# Patient Record
Sex: Female | Born: 1940 | Race: White | Hispanic: No | Marital: Married | Smoking: Never smoker
Health system: Southern US, Community
[De-identification: ages and names within clinical notes are randomized; demographics above are authoritative.]

## PROBLEM LIST (undated history)

## (undated) DIAGNOSIS — IMO0002 Reserved for concepts with insufficient information to code with codable children: Secondary | ICD-10-CM

## (undated) DIAGNOSIS — I10 Essential (primary) hypertension: Secondary | ICD-10-CM

## (undated) DIAGNOSIS — M8580 Other specified disorders of bone density and structure, unspecified site: Secondary | ICD-10-CM

## (undated) DIAGNOSIS — R011 Cardiac murmur, unspecified: Secondary | ICD-10-CM

## (undated) DIAGNOSIS — C4490 Unspecified malignant neoplasm of skin, unspecified: Secondary | ICD-10-CM

## (undated) HISTORY — DX: Other specified disorders of bone density and structure, unspecified site: M85.80

## (undated) HISTORY — DX: Unspecified malignant neoplasm of skin, unspecified: C44.90

## (undated) HISTORY — DX: Essential (primary) hypertension: I10

## (undated) HISTORY — DX: Reserved for concepts with insufficient information to code with codable children: IMO0002

## (undated) HISTORY — DX: Cardiac murmur, unspecified: R01.1

---

## 1999-01-21 ENCOUNTER — Other Ambulatory Visit: Admission: RE | Admit: 1999-01-21 | Discharge: 1999-01-21 | Payer: Self-pay | Admitting: Family Medicine

## 2000-01-03 ENCOUNTER — Other Ambulatory Visit: Admission: RE | Admit: 2000-01-03 | Discharge: 2000-01-03 | Payer: Self-pay | Admitting: Obstetrics and Gynecology

## 2000-02-10 ENCOUNTER — Encounter: Payer: Self-pay | Admitting: Obstetrics and Gynecology

## 2000-02-10 ENCOUNTER — Encounter: Admission: RE | Admit: 2000-02-10 | Discharge: 2000-02-10 | Payer: Self-pay | Admitting: Obstetrics and Gynecology

## 2000-05-14 ENCOUNTER — Encounter: Payer: Self-pay | Admitting: Obstetrics and Gynecology

## 2000-05-14 ENCOUNTER — Encounter: Admission: RE | Admit: 2000-05-14 | Discharge: 2000-05-14 | Payer: Self-pay | Admitting: Obstetrics and Gynecology

## 2001-01-03 ENCOUNTER — Other Ambulatory Visit: Admission: RE | Admit: 2001-01-03 | Discharge: 2001-01-03 | Payer: Self-pay | Admitting: Obstetrics and Gynecology

## 2001-03-19 ENCOUNTER — Ambulatory Visit (HOSPITAL_COMMUNITY): Admission: RE | Admit: 2001-03-19 | Discharge: 2001-03-19 | Payer: Self-pay | Admitting: Gastroenterology

## 2001-05-16 ENCOUNTER — Encounter: Admission: RE | Admit: 2001-05-16 | Discharge: 2001-05-16 | Payer: Self-pay | Admitting: Obstetrics and Gynecology

## 2001-05-16 ENCOUNTER — Encounter: Payer: Self-pay | Admitting: Obstetrics and Gynecology

## 2002-01-09 ENCOUNTER — Other Ambulatory Visit: Admission: RE | Admit: 2002-01-09 | Discharge: 2002-01-09 | Payer: Self-pay | Admitting: Obstetrics and Gynecology

## 2002-05-20 ENCOUNTER — Encounter: Admission: RE | Admit: 2002-05-20 | Discharge: 2002-05-20 | Payer: Self-pay | Admitting: Obstetrics and Gynecology

## 2002-05-20 ENCOUNTER — Encounter: Payer: Self-pay | Admitting: Obstetrics and Gynecology

## 2003-01-12 ENCOUNTER — Other Ambulatory Visit: Admission: RE | Admit: 2003-01-12 | Discharge: 2003-01-12 | Payer: Self-pay | Admitting: Obstetrics and Gynecology

## 2003-05-28 ENCOUNTER — Encounter: Admission: RE | Admit: 2003-05-28 | Discharge: 2003-05-28 | Payer: Self-pay | Admitting: Obstetrics and Gynecology

## 2003-05-28 ENCOUNTER — Encounter: Payer: Self-pay | Admitting: Obstetrics and Gynecology

## 2004-01-14 ENCOUNTER — Other Ambulatory Visit: Admission: RE | Admit: 2004-01-14 | Discharge: 2004-01-14 | Payer: Self-pay | Admitting: Obstetrics and Gynecology

## 2004-06-07 ENCOUNTER — Encounter: Admission: RE | Admit: 2004-06-07 | Discharge: 2004-06-07 | Payer: Self-pay | Admitting: Obstetrics and Gynecology

## 2005-01-17 ENCOUNTER — Other Ambulatory Visit: Admission: RE | Admit: 2005-01-17 | Discharge: 2005-01-17 | Payer: Self-pay | Admitting: *Deleted

## 2005-06-13 ENCOUNTER — Encounter: Admission: RE | Admit: 2005-06-13 | Discharge: 2005-06-13 | Payer: Self-pay | Admitting: Obstetrics and Gynecology

## 2006-01-18 ENCOUNTER — Other Ambulatory Visit: Admission: RE | Admit: 2006-01-18 | Discharge: 2006-01-18 | Payer: Self-pay | Admitting: Obstetrics & Gynecology

## 2006-06-15 ENCOUNTER — Encounter: Admission: RE | Admit: 2006-06-15 | Discharge: 2006-06-15 | Payer: Self-pay | Admitting: Obstetrics & Gynecology

## 2007-01-24 ENCOUNTER — Other Ambulatory Visit: Admission: RE | Admit: 2007-01-24 | Discharge: 2007-01-24 | Payer: Self-pay | Admitting: Obstetrics & Gynecology

## 2007-06-28 ENCOUNTER — Encounter: Admission: RE | Admit: 2007-06-28 | Discharge: 2007-06-28 | Payer: Self-pay | Admitting: Family Medicine

## 2008-01-30 ENCOUNTER — Other Ambulatory Visit: Admission: RE | Admit: 2008-01-30 | Discharge: 2008-01-30 | Payer: Self-pay | Admitting: Obstetrics & Gynecology

## 2008-06-29 ENCOUNTER — Encounter: Admission: RE | Admit: 2008-06-29 | Discharge: 2008-06-29 | Payer: Self-pay | Admitting: Internal Medicine

## 2009-02-04 LAB — HM PAP SMEAR: HM Pap smear: NORMAL

## 2009-06-18 HISTORY — PX: TOTAL VAGINAL HYSTERECTOMY: SHX2548

## 2009-06-22 ENCOUNTER — Encounter: Payer: Self-pay | Admitting: Obstetrics & Gynecology

## 2009-06-22 ENCOUNTER — Ambulatory Visit (HOSPITAL_COMMUNITY): Admission: RE | Admit: 2009-06-22 | Discharge: 2009-06-23 | Payer: Self-pay | Admitting: Obstetrics & Gynecology

## 2009-07-12 ENCOUNTER — Encounter: Admission: RE | Admit: 2009-07-12 | Discharge: 2009-07-12 | Payer: Self-pay | Admitting: Internal Medicine

## 2010-07-13 ENCOUNTER — Encounter: Admission: RE | Admit: 2010-07-13 | Discharge: 2010-07-13 | Payer: Self-pay | Admitting: Obstetrics & Gynecology

## 2010-12-22 LAB — BASIC METABOLIC PANEL
BUN: 4 mg/dL — ABNORMAL LOW (ref 6–23)
Chloride: 96 mEq/L (ref 96–112)
Creatinine, Ser: 0.77 mg/dL (ref 0.4–1.2)

## 2010-12-22 LAB — CBC
MCV: 95.7 fL (ref 78.0–100.0)
Platelets: 171 10*3/uL (ref 150–400)
WBC: 11.3 10*3/uL — ABNORMAL HIGH (ref 4.0–10.5)

## 2010-12-22 LAB — ABO/RH: ABO/RH(D): AB POS

## 2010-12-22 LAB — TYPE AND SCREEN: ABO/RH(D): AB POS

## 2010-12-23 LAB — PROTIME-INR
INR: 1 (ref 0.00–1.49)
Prothrombin Time: 13.4 seconds (ref 11.6–15.2)

## 2010-12-23 LAB — URINALYSIS, ROUTINE W REFLEX MICROSCOPIC
Bilirubin Urine: NEGATIVE
Glucose, UA: NEGATIVE mg/dL
Ketones, ur: NEGATIVE mg/dL
Nitrite: NEGATIVE
Protein, ur: NEGATIVE mg/dL
Specific Gravity, Urine: 1.008 (ref 1.005–1.030)
Urobilinogen, UA: 0.2 mg/dL (ref 0.0–1.0)
pH: 6.5 (ref 5.0–8.0)

## 2010-12-23 LAB — BASIC METABOLIC PANEL
BUN: 8 mg/dL (ref 6–23)
Calcium: 9.2 mg/dL (ref 8.4–10.5)
Chloride: 107 mEq/L (ref 96–112)
GFR calc Af Amer: >60 mL/min (ref >60)

## 2010-12-23 LAB — CBC
MCV: 94.5 fL (ref 78.0–100.0)
Platelets: 179 10*3/uL (ref 150–400)
RDW: 13.2 % (ref 11.5–15.5)

## 2010-12-23 LAB — APTT: aPTT: 29 seconds (ref 24–37)

## 2011-02-03 NOTE — Procedures (Signed)
. [facility]  Patient:    Rose Olson, Rose Olson                    MRN: [id number] Proc. Date: 03/19/01 Attending:  Anselmo Rod, M.D. CC:         Laqueta Linden, M.D.   Procedure Report  DATE OF BIRTH:  1941/04/25.  PROCEDURE:  Colonoscopy.  ENDOSCOPIST:  Anselmo Rod, M.D.  INSTRUMENT USED:  Olympus video colonoscope.  INDICATION FOR PROCEDURE:  Guaiac-positive stools in a 70 year old white female with a family history of breast cancer.  Rule out colonic polyps, masses, hemorrhoids, etc.  PREPROCEDURE PREPARATION:  Informed consent was procured from the patient. The patient was fasted for eight hours prior to the procedure and prepped with a bottle of magnesium citrate and a gallon of NuLytely the night prior to the procedure.  PREPROCEDURE PHYSICAL:  VITAL SIGNS:  The patient had stable vital signs.  NECK:  Supple.  CHEST:  Clear to auscultation.  S1, S2 regular.  ABDOMEN:  Soft with normal bowel sounds.  DESCRIPTION OF PROCEDURE:  The patient was placed in the left lateral decubitus position and sedated with 50 mg of Demerol and 5 mg of Versed intravenously.  Once the patient was adequately sedate and maintained on low-flow oxygen and continuous cardiac monitoring, the Olympus video colonoscope was advanced from the rectum to the cecum with difficulty secondary to a large amount of residual stool, especially in the right colon. There was left-sided diverticulosis with some stool in some of the diverticular pocket.  No masses, polyps, erosions, or ulcerations were seen. No masses were identified.  The patient tolerated the procedure well without complication.  The procedure was complete up to the cecum; however, because of the incomplete prep, very small lesions could have been missed.  RECOMMENDATIONS: 1. A high-fiber diet has been discussed in great detail with the patient, with    liberal fluid intake. 2. Repeat guaiacs  will be done on an outpatient basis and further    recommendations made thereafter. DD:  03/19/01 TD:  03/20/01 Job: [id number]

## 2011-05-02 ENCOUNTER — Other Ambulatory Visit: Payer: Self-pay | Admitting: Family Medicine

## 2011-05-02 DIAGNOSIS — Z1231 Encounter for screening mammogram for malignant neoplasm of breast: Secondary | ICD-10-CM

## 2011-07-17 ENCOUNTER — Ambulatory Visit
Admission: RE | Admit: 2011-07-17 | Discharge: 2011-07-17 | Disposition: A | Discharge status: Home / Self Care | Payer: Medicare Other | Source: Ambulatory Visit | Attending: Family Medicine | Admitting: Family Medicine

## 2011-07-17 DIAGNOSIS — Z1231 Encounter for screening mammogram for malignant neoplasm of breast: Secondary | ICD-10-CM

## 2011-10-02 DIAGNOSIS — Z1331 Encounter for screening for depression: Secondary | ICD-10-CM | POA: Diagnosis not present

## 2011-10-02 DIAGNOSIS — I10 Essential (primary) hypertension: Secondary | ICD-10-CM | POA: Diagnosis not present

## 2012-03-11 DIAGNOSIS — Z124 Encounter for screening for malignant neoplasm of cervix: Secondary | ICD-10-CM | POA: Diagnosis not present

## 2012-03-11 DIAGNOSIS — Z01419 Encounter for gynecological examination (general) (routine) without abnormal findings: Secondary | ICD-10-CM | POA: Diagnosis not present

## 2012-04-29 ENCOUNTER — Other Ambulatory Visit: Payer: Self-pay | Admitting: Family Medicine

## 2012-04-29 DIAGNOSIS — Z1231 Encounter for screening mammogram for malignant neoplasm of breast: Secondary | ICD-10-CM

## 2012-05-27 DIAGNOSIS — H353190 Nonexudative age-related macular degeneration, unspecified eye, stage unspecified: Secondary | ICD-10-CM | POA: Diagnosis not present

## 2012-05-27 DIAGNOSIS — H2510 Age-related nuclear cataract, unspecified eye: Secondary | ICD-10-CM | POA: Diagnosis not present

## 2012-07-08 DIAGNOSIS — H353190 Nonexudative age-related macular degeneration, unspecified eye, stage unspecified: Secondary | ICD-10-CM | POA: Diagnosis not present

## 2012-07-17 ENCOUNTER — Other Ambulatory Visit: Payer: Self-pay | Admitting: Obstetrics & Gynecology

## 2012-07-17 ENCOUNTER — Ambulatory Visit
Admission: RE | Admit: 2012-07-17 | Discharge: 2012-07-17 | Disposition: A | Discharge status: Home / Self Care | Payer: Medicare Other | Source: Ambulatory Visit | Attending: Family Medicine | Admitting: Family Medicine

## 2012-07-17 DIAGNOSIS — Z1231 Encounter for screening mammogram for malignant neoplasm of breast: Secondary | ICD-10-CM | POA: Diagnosis not present

## 2012-07-17 DIAGNOSIS — Z23 Encounter for immunization: Secondary | ICD-10-CM | POA: Diagnosis not present

## 2012-07-19 ENCOUNTER — Other Ambulatory Visit: Payer: Self-pay | Admitting: Family Medicine

## 2012-07-19 DIAGNOSIS — R928 Other abnormal and inconclusive findings on diagnostic imaging of breast: Secondary | ICD-10-CM

## 2012-07-23 DIAGNOSIS — H40029 Open angle with borderline findings, high risk, unspecified eye: Secondary | ICD-10-CM | POA: Diagnosis not present

## 2012-07-29 ENCOUNTER — Ambulatory Visit
Admission: RE | Admit: 2012-07-29 | Discharge: 2012-07-29 | Disposition: A | Discharge status: Home / Self Care | Payer: Medicare Other | Source: Ambulatory Visit | Attending: Family Medicine | Admitting: Family Medicine

## 2012-07-29 DIAGNOSIS — R928 Other abnormal and inconclusive findings on diagnostic imaging of breast: Secondary | ICD-10-CM

## 2012-10-02 DIAGNOSIS — Z0000 Encounter for general adult medical examination without abnormal findings: Secondary | ICD-10-CM | POA: Diagnosis not present

## 2012-10-02 DIAGNOSIS — Z1331 Encounter for screening for depression: Secondary | ICD-10-CM | POA: Diagnosis not present

## 2012-10-02 DIAGNOSIS — I10 Essential (primary) hypertension: Secondary | ICD-10-CM | POA: Diagnosis not present

## 2013-03-06 ENCOUNTER — Other Ambulatory Visit: Payer: Self-pay | Admitting: Obstetrics & Gynecology

## 2013-04-02 DIAGNOSIS — R079 Chest pain, unspecified: Secondary | ICD-10-CM | POA: Diagnosis not present

## 2013-04-02 DIAGNOSIS — I10 Essential (primary) hypertension: Secondary | ICD-10-CM | POA: Diagnosis not present

## 2013-04-02 DIAGNOSIS — R011 Cardiac murmur, unspecified: Secondary | ICD-10-CM | POA: Diagnosis not present

## 2013-04-09 ENCOUNTER — Encounter: Payer: Self-pay | Admitting: Obstetrics & Gynecology

## 2013-04-10 ENCOUNTER — Ambulatory Visit (INDEPENDENT_AMBULATORY_CARE_PROVIDER_SITE_OTHER): Payer: Medicare Other | Admitting: Obstetrics & Gynecology

## 2013-04-10 ENCOUNTER — Encounter: Payer: Self-pay | Admitting: Obstetrics & Gynecology

## 2013-04-10 VITALS — BP 138/62 | HR 56 | Resp 20 | Ht 62.5 in | Wt 110.2 lb

## 2013-04-10 DIAGNOSIS — Z124 Encounter for screening for malignant neoplasm of cervix: Secondary | ICD-10-CM

## 2013-04-10 DIAGNOSIS — Z01419 Encounter for gynecological examination (general) (routine) without abnormal findings: Secondary | ICD-10-CM

## 2013-04-10 MED ORDER — EVISTA 60 MG PO TABS: 60.0000 mg | ORAL_TABLET | Freq: Every day | ORAL | Status: DC

## 2013-04-10 NOTE — Progress Notes (Signed)
72 y.o. [id number] MarriedCaucasianF here for annual exam.  Doing well.  No vaginal bleeding.  Does report she gets up about once a night to urinate.  No leaking.  She has questions about her vitamins.  She also questions whether Splenda is "safe".  Patient's last menstrual period was 09/18/1989.          Sexually active: yes  The current method of family planning is status post hysterectomy.    Exercising: yes  aerobics and walking Smoker:  no  Health Maintenance: Pap:  02/04/09 WNL History of abnormal Pap:  no MMG:  07/17/12 MMG, 07/29/12 Diag left MMG/US-screening in one year Colonoscopy:  9/07 repeat in 10 years BMD:   10/11, -1.4/-2.1 TDaP:  9/07 Screening Labs: 10/02/12--CMP and Lipids--normal.  Pt brought copies for me to see but wanted to keep the originals   reports that she has never smoked. She has never used smokeless tobacco. She reports that she does not drink alcohol or use illicit drugs.  Past Medical History  Diagnosis Date  . Hypertension   . Osteopenia   . Cystocele   . Skin cancer   . Heart murmur     echocardiogram scheduled 04/11/13    Past Surgical History  Procedure Laterality Date  . Total vaginal hysterectomy  10/10    with mesh cuff suspension    Current Outpatient Prescriptions  Medication Sig Dispense Refill  . aspirin EC 81 MG tablet Take 81 mg by mouth daily.      . B Complex-C (SUPER B COMPLEX PO) Take 1 tablet by mouth daily.      . Calcium Carbonate-Vitamin D (CALCIUM 600+D) 600-400 MG-UNIT per tablet Take 1 tablet by mouth daily.      . cholecalciferol (VITAMIN D) 1000 UNITS tablet Take 1,000 Units by mouth daily.      Marland Kitchen EVISTA 60 MG tablet TAKE 1 TABLET DAILY  30 tablet  0  . Multiple Vitamins-Minerals (ICAPS) CAPS Take by mouth. And lutein and zeaxanthin 2 tablets daily      . Omega-3 Fatty Acids (FISH OIL) 1200 MG CAPS Take 1 capsule by mouth daily.       . Psyllium (METAMUCIL PO) Take by mouth 2 (two) times daily.      Marland Kitchen  triamterene-hydrochlorothiazide (MAXZIDE-25) 37.5-25 MG per tablet Take 0.5 tablets by mouth daily.       No current facility-administered medications for this visit.    Family History  Problem Relation Age of Onset  . Cancer Sister 63    invasive ductal cancer  . Hypertension Mother   . Hypertension Father   . CVA Paternal Grandfather   . Heart Problems Sister     oldest of 7    ROS:  Pertinent items are noted in HPI.  Otherwise, a comprehensive ROS was negative.  Exam:   BP 138/62  Pulse 56  Resp 20  Ht 5' 2.5" (1.588 m)  Wt 110 lb 3.2 oz (49.986 kg)  BMI 19.82 kg/m2  LMP 09/18/1989  Weight change: no change   Height: 5' 2.5" (158.8 cm)  Ht Readings from Last 3 Encounters:  04/10/13 5' 2.5" (1.588 m)    General appearance: alert, cooperative and appears stated age Head: Normocephalic, without obvious abnormality, atraumatic Neck: no adenopathy, supple, symmetrical, trachea midline and thyroid normal to inspection and palpation Lungs: clear to auscultation bilaterally Breasts: normal appearance, no masses or tenderness Heart: regular rate and rhythm Abdomen: soft, non-tender; bowel sounds normal; no masses,  no organomegaly  Extremities: extremities normal, atraumatic, no cyanosis or edema Skin: Skin color, texture, turgor normal. No rashes or lesions Lymph nodes: Cervical, supraclavicular, and axillary nodes normal. No abnormal inguinal nodes palpated Neurologic: Grossly normal   Pelvic: External genitalia:  no lesions              Urethra:  normal appearing urethra with no masses, tenderness or lesions              Bartholins and Skenes: normal                 Vagina: normal appearing vagina with normal color and discharge, no lesions              Cervix: absent              Pap taken: no Bimanual Exam:  Uterus:  uterus absent              Adnexa: no mass, fullness, tenderness               Rectovaginal: Confirms               Anus:  normal sphincter tone, no  lesions  A:  Well Woman with normal exam Family hx of breast cancer--2 sisters 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  Rx to mail order. BMD next year. Labs with PCP. return annually or prn  An After Visit Summary was printed and given to the patient.

## 2013-04-10 NOTE — Patient Instructions (Addendum)

## 2013-04-11 DIAGNOSIS — R011 Cardiac murmur, unspecified: Secondary | ICD-10-CM | POA: Diagnosis not present

## 2013-04-11 DIAGNOSIS — R079 Chest pain, unspecified: Secondary | ICD-10-CM | POA: Diagnosis not present

## 2013-04-11 DIAGNOSIS — I10 Essential (primary) hypertension: Secondary | ICD-10-CM | POA: Diagnosis not present

## 2013-04-22 ENCOUNTER — Other Ambulatory Visit: Payer: Self-pay

## 2013-04-22 DIAGNOSIS — Z1231 Encounter for screening mammogram for malignant neoplasm of breast: Secondary | ICD-10-CM

## 2013-04-23 ENCOUNTER — Other Ambulatory Visit: Payer: Self-pay

## 2013-05-28 DIAGNOSIS — H40029 Open angle with borderline findings, high risk, unspecified eye: Secondary | ICD-10-CM | POA: Diagnosis not present

## 2013-05-28 DIAGNOSIS — H3530 Unspecified macular degeneration: Secondary | ICD-10-CM | POA: Diagnosis not present

## 2013-05-28 DIAGNOSIS — H269 Unspecified cataract: Secondary | ICD-10-CM | POA: Diagnosis not present

## 2013-06-06 DIAGNOSIS — I10 Essential (primary) hypertension: Secondary | ICD-10-CM | POA: Diagnosis not present

## 2013-06-06 DIAGNOSIS — R011 Cardiac murmur, unspecified: Secondary | ICD-10-CM | POA: Diagnosis not present

## 2013-06-06 DIAGNOSIS — R0789 Other chest pain: Secondary | ICD-10-CM | POA: Diagnosis not present

## 2013-06-30 DIAGNOSIS — Z23 Encounter for immunization: Secondary | ICD-10-CM | POA: Diagnosis not present

## 2013-07-18 ENCOUNTER — Ambulatory Visit
Admission: RE | Admit: 2013-07-18 | Discharge: 2013-07-18 | Disposition: A | Discharge status: Home / Self Care | Payer: Medicare Other | Source: Ambulatory Visit

## 2013-07-18 DIAGNOSIS — Z1231 Encounter for screening mammogram for malignant neoplasm of breast: Secondary | ICD-10-CM | POA: Diagnosis not present

## 2013-07-24 ENCOUNTER — Other Ambulatory Visit: Payer: Self-pay

## 2013-07-30 DIAGNOSIS — H40029 Open angle with borderline findings, high risk, unspecified eye: Secondary | ICD-10-CM | POA: Diagnosis not present

## 2013-11-06 DIAGNOSIS — Z1331 Encounter for screening for depression: Secondary | ICD-10-CM | POA: Diagnosis not present

## 2013-11-06 DIAGNOSIS — I10 Essential (primary) hypertension: Secondary | ICD-10-CM | POA: Diagnosis not present

## 2013-11-06 DIAGNOSIS — Z23 Encounter for immunization: Secondary | ICD-10-CM | POA: Diagnosis not present

## 2014-05-11 DIAGNOSIS — I10 Essential (primary) hypertension: Secondary | ICD-10-CM | POA: Diagnosis not present

## 2014-05-14 ENCOUNTER — Other Ambulatory Visit: Payer: Self-pay

## 2014-05-14 DIAGNOSIS — Z1231 Encounter for screening mammogram for malignant neoplasm of breast: Secondary | ICD-10-CM

## 2014-06-03 DIAGNOSIS — H3530 Unspecified macular degeneration: Secondary | ICD-10-CM | POA: Diagnosis not present

## 2014-06-03 DIAGNOSIS — H35369 Drusen (degenerative) of macula, unspecified eye: Secondary | ICD-10-CM | POA: Diagnosis not present

## 2014-06-12 ENCOUNTER — Ambulatory Visit (INDEPENDENT_AMBULATORY_CARE_PROVIDER_SITE_OTHER): Payer: Medicare Other | Admitting: Obstetrics & Gynecology

## 2014-06-12 ENCOUNTER — Encounter: Payer: Self-pay | Admitting: Obstetrics & Gynecology

## 2014-06-12 VITALS — BP 126/80 | HR 56 | Resp 16 | Ht 62.0 in | Wt 113.8 lb

## 2014-06-12 DIAGNOSIS — Z124 Encounter for screening for malignant neoplasm of cervix: Secondary | ICD-10-CM

## 2014-06-12 DIAGNOSIS — Z01419 Encounter for gynecological examination (general) (routine) without abnormal findings: Secondary | ICD-10-CM | POA: Diagnosis not present

## 2014-06-12 NOTE — Progress Notes (Signed)
72 y.o. [id number] MarriedCaucasianF here for annual exam.  Just got back from trip to [facility].  Went with two sisters.  No vaginal bleeding.    D/W pt will stop Evista and check BMD this fall.  Has MMG scheduled.  If BMD stable, will stay off medication and recheck BMD in 2-3 years.  PCP:  Dr. Laurann Montana.  Seen in 2/15.    Patient's last menstrual period was 09/18/1989.          Sexually active: Yes.    The current method of family planning is status post hysterectomy.    Exercising: Yes.    aerobics, weight training, and walking Smoker:  no  Health Maintenance: Pap:  02/04/09 WNL History of abnormal Pap:  no MMG:  07/18/13-normal, scheduled 07/20/14 Colonoscopy:  05/25/06-repeat in 10 years BMD:   10/11, -1.4/-2.1 TDaP:  9/07 Screening Labs: PCP, Hb today: PCP, Urine today: PCP   reports that she has never smoked. She has never used smokeless tobacco. She reports that she does not drink alcohol or use illicit drugs.  Past Medical History  Diagnosis Date  . Hypertension   . Osteopenia   . Cystocele   . Skin cancer   . Heart murmur     echocardiogram scheduled 04/11/13    Past Surgical History  Procedure Laterality Date  . Total vaginal hysterectomy  10/10    with mesh cuff suspension    Current Outpatient Prescriptions  Medication Sig Dispense Refill  . aspirin EC 81 MG tablet Take 81 mg by mouth daily.      . Calcium Carbonate-Vitamin D (CALCIUM 600+D) 600-400 MG-UNIT per tablet Take 1 tablet by mouth daily.      . cholecalciferol (VITAMIN D) 1000 UNITS tablet Take 1,000 Units by mouth daily.      Marland Kitchen EVISTA 60 MG tablet Take 1 tablet (60 mg total) by mouth daily.  90 tablet  4  . Multiple Vitamins-Minerals (ICAPS) CAPS Take by mouth daily.      . Omega-3 Fatty Acids (FISH OIL) 1200 MG CAPS Take 1 capsule by mouth daily.       . Psyllium (METAMUCIL PO) Take by mouth 2 (two) times daily.      Marland Kitchen triamterene-hydrochlorothiazide (MAXZIDE-25) 37.5-25 MG per tablet Take 0.5  tablets by mouth daily.       No current facility-administered medications for this visit.    Family History  Problem Relation Age of Onset  . Cancer Sister 45    invasive ductal cancer  . Hypertension Mother   . Hypertension Father   . CVA Paternal Grandfather   . Heart Problems Sister     oldest of 7    ROS:  Pertinent items are noted in HPI.  Otherwise, a comprehensive ROS was negative.  Exam:   BP 126/80  Pulse 56  Resp 16  Ht 5\' 2"  (1.575 m)  Wt 113 lb 12.8 oz (51.619 kg)  BMI 20.81 kg/m2  LMP 09/18/1989  Weight change: +3#   Height: 5\' 2"  (157.5 cm)  Ht Readings from Last 3 Encounters:  06/12/14 5\' 2"  (1.575 m)  04/10/13 5' 2.5" (1.588 m)    General appearance: alert, cooperative and appears stated age Head: Normocephalic, without obvious abnormality, atraumatic Neck: no adenopathy, supple, symmetrical, trachea midline and thyroid normal to inspection and palpation Lungs: clear to auscultation bilaterally Breasts: normal appearance, no masses or tenderness Heart: regular rate and rhythm Abdomen: soft, non-tender; bowel sounds normal; no masses,  no organomegaly Extremities: extremities  normal, atraumatic, no cyanosis or edema Skin: Skin color, texture, turgor normal. No rashes or lesions Lymph nodes: Cervical, supraclavicular, and axillary nodes normal. No abnormal inguinal nodes palpated Neurologic: Grossly normal   Pelvic: External genitalia:  no lesions              Urethra:  normal appearing urethra with no masses, tenderness or lesions              Bartholins and Skenes: normal                 Vagina: normal appearing vagina with normal color and discharge, no lesions              Cervix: absent              Pap taken: No. Bimanual Exam:  Uterus:  uterus absent              Adnexa: no mass, fullness, tenderness               Rectovaginal: Confirms               Anus:  normal sphincter tone, no lesions  A:  Well Woman with normal exam  Family hx of  breast cancer--2 sisters  H/O TVH with vault repair with mesh 10/10 Mild OAB.  No medications Hypertension  Osteopenia.  On Evista.   P: Mammogram yearly.  D/W pt 3D due to breast density. No Pap needed  Recommended stopping Evista now.  Will check BMD with MMG and then hopefully will just have a drug holiday for 2-3 years.    Labs with PCP.  return annually or prn  An After Visit Summary was printed and given to the patient.

## 2014-06-15 DIAGNOSIS — Z23 Encounter for immunization: Secondary | ICD-10-CM | POA: Diagnosis not present

## 2014-06-22 ENCOUNTER — Telehealth: Payer: Self-pay | Admitting: Emergency Medicine

## 2014-06-22 NOTE — Telephone Encounter (Signed)
Detailed message left to advise patient that DEXA bone density was added on to her previously scheduled Mammogram screening appoinmtnet. Arrive at 1015 for 07/20/14 at 1015. At [employer].

## 2014-07-07 DIAGNOSIS — H40023 Open angle with borderline findings, high risk, bilateral: Secondary | ICD-10-CM | POA: Diagnosis not present

## 2014-07-14 ENCOUNTER — Telehealth: Payer: Self-pay | Admitting: Obstetrics & Gynecology

## 2014-07-14 DIAGNOSIS — M8580 Other specified disorders of bone density and structure, unspecified site: Secondary | ICD-10-CM

## 2014-07-14 DIAGNOSIS — E2839 Other primary ovarian failure: Secondary | ICD-10-CM

## 2014-07-14 NOTE — Telephone Encounter (Signed)
Return call to patient.  Patient very upset to learn that she was scheduled for Bone Density exam at [facility], when she is a patient of the breast center.  Offered apology for location change and attempted to reschedule x 3 after multiple phone calls to the [facility] and back to patient which was patients preference that this RN call. Patient could not take the multiple options offered to her to schedule at the breast center.  Patient now upset to learn she was not scheduled for 3D screening mammogram, as Dr. Sabra Heck has recommended. I advised patient that she called to schedule mammogram and that if she would like 3D mammogram it could be changed. Advised bone density was cancelled at [facility], spoke with [facility].  Called [facility] with patient on the line, so that patient could choose from options offered from the breast center. Patient is now scheduled for 3D mammogram and bone density testing. Scheduled for 08/10/14 at 2:30. Patient agreeable to time. Patient states that she hopes this does not happen again and that "You can't even imagine how upset I was when I learned of two different appointments at two different locations and how could you have even made this mistake?" Again, offered apology to patient and that I was happy to assist with rescheduling and that [facility] could accommodate her scheduling needs. Advised to call our office back if any further concerns, she is agreeable.  Routing to provider for final review. Patient agreeable to disposition. Will close encounter

## 2014-07-14 NOTE — Telephone Encounter (Signed)
Pt called during lunch Lm asking to speak with Olivia Mackie

## 2014-07-14 NOTE — Telephone Encounter (Signed)
Patient calling re: appointment mixup with MMG (3-D) and bone density already scheduled but order was not sent to the correct facility. She is a patient at [facility] and has a MMG appointment there 07/20/14. She is concerned and doesn't understand why we would schedule her at two different facilities and is concerned she will get a no-show fee from one. Please advise?

## 2014-07-20 ENCOUNTER — Ambulatory Visit: Payer: Medicare Other

## 2014-07-20 ENCOUNTER — Encounter: Payer: Self-pay | Admitting: Obstetrics & Gynecology

## 2014-07-27 ENCOUNTER — Other Ambulatory Visit: Payer: Medicare Other

## 2014-07-30 DIAGNOSIS — D2239 Melanocytic nevi of other parts of face: Secondary | ICD-10-CM | POA: Diagnosis not present

## 2014-07-30 DIAGNOSIS — B351 Tinea unguium: Secondary | ICD-10-CM | POA: Diagnosis not present

## 2014-07-30 DIAGNOSIS — D225 Melanocytic nevi of trunk: Secondary | ICD-10-CM | POA: Diagnosis not present

## 2014-07-30 DIAGNOSIS — D1801 Hemangioma of skin and subcutaneous tissue: Secondary | ICD-10-CM | POA: Diagnosis not present

## 2014-07-30 DIAGNOSIS — L821 Other seborrheic keratosis: Secondary | ICD-10-CM | POA: Diagnosis not present

## 2014-07-30 DIAGNOSIS — L905 Scar conditions and fibrosis of skin: Secondary | ICD-10-CM | POA: Diagnosis not present

## 2014-07-30 DIAGNOSIS — D2262 Melanocytic nevi of left upper limb, including shoulder: Secondary | ICD-10-CM | POA: Diagnosis not present

## 2014-07-30 DIAGNOSIS — D2271 Melanocytic nevi of right lower limb, including hip: Secondary | ICD-10-CM | POA: Diagnosis not present

## 2014-08-10 ENCOUNTER — Ambulatory Visit
Admission: RE | Admit: 2014-08-10 | Discharge: 2014-08-10 | Disposition: A | Discharge status: Home / Self Care | Payer: Medicare Other | Source: Ambulatory Visit | Attending: Obstetrics & Gynecology | Admitting: Obstetrics & Gynecology

## 2014-08-10 ENCOUNTER — Ambulatory Visit
Admission: RE | Admit: 2014-08-10 | Discharge: 2014-08-10 | Disposition: A | Discharge status: Home / Self Care | Payer: Medicare Other | Source: Ambulatory Visit

## 2014-08-10 DIAGNOSIS — M8580 Other specified disorders of bone density and structure, unspecified site: Secondary | ICD-10-CM

## 2014-08-10 DIAGNOSIS — Z1231 Encounter for screening mammogram for malignant neoplasm of breast: Secondary | ICD-10-CM | POA: Diagnosis not present

## 2014-08-10 DIAGNOSIS — E2839 Other primary ovarian failure: Secondary | ICD-10-CM

## 2014-08-10 DIAGNOSIS — Z780 Asymptomatic menopausal state: Secondary | ICD-10-CM | POA: Diagnosis not present

## 2014-08-17 DIAGNOSIS — J069 Acute upper respiratory infection, unspecified: Secondary | ICD-10-CM | POA: Diagnosis not present

## 2014-08-18 ENCOUNTER — Telehealth: Payer: Self-pay | Admitting: Obstetrics & Gynecology

## 2014-08-18 NOTE — Telephone Encounter (Signed)
Spoke with pt.  Feels risks of Evista outweight benefits due to her age.  D/w pt these risks and AEX.  Has sisters with breast cancer.  Pt has been on Evista for 10+ years.  Risks of DVT/PE/stroke reviewed with pt.  Will repeat BMD 2 years and will do drug holiday these next two years.  Pt in agreement.  OK to close encounter.

## 2014-08-18 NOTE — Telephone Encounter (Signed)
Pt wants to discuss her bone density results with Dr.Miller.

## 2014-08-18 NOTE — Telephone Encounter (Signed)
Spoke with patient. Patient states that she got a message from [facility] regarding BMD results and was told to call with any questions. "From what I can see there have not been any changes to my results. At my annual exam Dr.Miller told me to stop taking Evista. From those results I do not see why I would not keep taking it. I have a younger sister and an older sister who had breast cancer. I have read up on the side effects of Evista and if it can help me in any way that would be great." Advised patient would send a message over to [facility] for review and return call with any further recommendations. Patient is agreeable. Message regarding BMD results to patient from [facility] below.  Entered by Lyman Speller, MD at 08/12/2014 6:36 AM     Read by Celso Amy at 08/13/2014 11:38 AM    Rose Olson,  Your bone density still has some osteopenia present but it is stable. I will repeat your bone density in t3-5 years. Please let me know if you have any questions.   Dr. Sabra Heck

## 2014-11-09 DIAGNOSIS — Z1389 Encounter for screening for other disorder: Secondary | ICD-10-CM | POA: Diagnosis not present

## 2014-11-09 DIAGNOSIS — Z0000 Encounter for general adult medical examination without abnormal findings: Secondary | ICD-10-CM | POA: Diagnosis not present

## 2014-11-09 DIAGNOSIS — I10 Essential (primary) hypertension: Secondary | ICD-10-CM | POA: Diagnosis not present

## 2014-11-27 DIAGNOSIS — J209 Acute bronchitis, unspecified: Secondary | ICD-10-CM | POA: Diagnosis not present

## 2015-05-18 DIAGNOSIS — M859 Disorder of bone density and structure, unspecified: Secondary | ICD-10-CM | POA: Diagnosis not present

## 2015-05-18 DIAGNOSIS — I10 Essential (primary) hypertension: Secondary | ICD-10-CM | POA: Diagnosis not present

## 2015-05-27 ENCOUNTER — Other Ambulatory Visit: Payer: Self-pay

## 2015-05-27 DIAGNOSIS — Z1231 Encounter for screening mammogram for malignant neoplasm of breast: Secondary | ICD-10-CM

## 2015-06-07 DIAGNOSIS — H35033 Hypertensive retinopathy, bilateral: Secondary | ICD-10-CM | POA: Diagnosis not present

## 2015-06-07 DIAGNOSIS — H52223 Regular astigmatism, bilateral: Secondary | ICD-10-CM | POA: Diagnosis not present

## 2015-06-07 DIAGNOSIS — H40023 Open angle with borderline findings, high risk, bilateral: Secondary | ICD-10-CM | POA: Diagnosis not present

## 2015-07-05 DIAGNOSIS — H40023 Open angle with borderline findings, high risk, bilateral: Secondary | ICD-10-CM | POA: Diagnosis not present

## 2015-07-08 DIAGNOSIS — Z23 Encounter for immunization: Secondary | ICD-10-CM | POA: Diagnosis not present

## 2015-07-30 ENCOUNTER — Encounter: Payer: Self-pay | Admitting: Obstetrics & Gynecology

## 2015-07-30 ENCOUNTER — Ambulatory Visit (INDEPENDENT_AMBULATORY_CARE_PROVIDER_SITE_OTHER): Payer: TRICARE For Life (TFL) | Admitting: Obstetrics & Gynecology

## 2015-07-30 VITALS — BP 140/78 | HR 60 | Resp 16 | Ht 62.0 in | Wt 113.0 lb

## 2015-07-30 DIAGNOSIS — M8580 Other specified disorders of bone density and structure, unspecified site: Secondary | ICD-10-CM | POA: Diagnosis not present

## 2015-07-30 DIAGNOSIS — Z124 Encounter for screening for malignant neoplasm of cervix: Secondary | ICD-10-CM

## 2015-07-30 DIAGNOSIS — Z0000 Encounter for general adult medical examination without abnormal findings: Secondary | ICD-10-CM | POA: Diagnosis not present

## 2015-07-30 DIAGNOSIS — Z01419 Encounter for gynecological examination (general) (routine) without abnormal findings: Secondary | ICD-10-CM | POA: Diagnosis not present

## 2015-07-30 LAB — POCT URINALYSIS DIPSTICK
Bilirubin, UA: NEGATIVE
Glucose, UA: NEGATIVE
Ketones, UA: NEGATIVE
Leukocytes, UA: NEGATIVE
NITRITE UA: NEGATIVE
PH UA: 5
Protein, UA: NEGATIVE
RBC UA: NEGATIVE
UROBILINOGEN UA: NEGATIVE

## 2015-07-30 NOTE — Addendum Note (Signed)
Addended by: Megan Salon on: 07/30/2015 03:11 PM   Modules accepted: Miqu[facility]

## 2015-07-30 NOTE — Progress Notes (Signed)
74 y.o. [id number] MarriedCaucasianF here for annual exam.  Doing well.  Denies vaginal bleeding.  Denies urinary issues.  Pt reports she gets up one a night, usually.    Older sister died with breast cancer last year.  She was diagnosed around age 90.  She died age 35.  Younger sister diagnosed around age 27.  Pt is wondering about genetic testing.  We discussed testing through [facility], what the test means, possible options for increased screening or surgery depending on pt desires.  She is going to consider this.  PCP:  Dr. Laurann Montana.  Lab work done 11/09/14.  Pt reports she does have exam planned for next year.    Patient's last menstrual period was 09/18/1989.          Sexually active: Yes.    The current method of family planning is status post hysterectomy.    Exercising: Yes.    walking and aerobic Smoker:  no  Health Maintenance: Pap:  02/04/09 WNL History of abnormal Pap:  no MMG:  08/10/14 3D-BiRads 1 negative Colonoscopy:  05/25/06-repeat in 10 years BMD:   11/15-stable osteopenia TDaP:  3/16 Screening Labs: PCP, Hb today: PCP, Urine today: n/a   reports that she has never smoked. She has never used smokeless tobacco. She reports that she does not drink alcohol or use illicit drugs.  Past Medical History  Diagnosis Date  . Hypertension   . Osteopenia   . Cystocele   . Skin cancer   . Heart murmur     echocardiogram scheduled 04/11/13    Past Surgical History  Procedure Laterality Date  . Total vaginal hysterectomy  10/10    with mesh cuff suspension    Current Outpatient Prescriptions  Medication Sig Dispense Refill  . aspirin EC 81 MG tablet Take 81 mg by mouth daily.    . Calcium Carbonate-Vitamin D (CALCIUM 600+D) 600-400 MG-UNIT per tablet Take 1 tablet by mouth daily.    . cholecalciferol (VITAMIN D) 1000 UNITS tablet Take 1,000 Units by mouth daily.    Marland Kitchen FLUZONE HIGH-DOSE 0.5 ML SUSY inject 0.5 milliliter intramuscularly  0  . loratadine (CLARITIN) 10  MG tablet Take 10 mg by mouth daily.    . Multiple Vitamins-Minerals (ICAPS) CAPS Take by mouth daily.    . Omega-3 Fatty Acids (FISH OIL) 1200 MG CAPS Take 1 capsule by mouth daily.     . Psyllium (METAMUCIL PO) Take by mouth 2 (two) times daily.    Marland Kitchen triamterene-hydrochlorothiazide (MAXZIDE-25) 37.5-25 MG per tablet Take 0.5 tablets by mouth daily.     No current facility-administered medications for this visit.    Family History  Problem Relation Age of Onset  . Cancer Sister 68    invasive ductal cancer-oldest and younger sister  . Hypertension Mother   . Hypertension Father   . CVA Paternal Grandfather   . Heart Problems Sister     oldest of 7    ROS:  Pertinent items are noted in HPI.  Otherwise, a comprehensive ROS was negative.  Exam:   BP 160/84 mmHg  Pulse 60  Resp 16  Ht 5\' 2"  (1.575 m)  Wt 113 lb (51.256 kg)  BMI 20.66 kg/m2  LMP 09/18/1989  Weight change:   Height: 5\' 2"  (157.5 cm)  Ht Readings from Last 3 Encounters:  07/30/15 5\' 2"  (1.575 m)  06/12/14 5\' 2"  (1.575 m)  04/10/13 5' 2.5" (1.588 m)    General appearance: alert, cooperative and appears stated  age Head: Normocephalic, without obvious abnormality, atraumatic Neck: no adenopathy, supple, symmetrical, trachea midline and thyroid normal to inspection and palpation Lungs: clear to auscultation bilaterally Breasts: normal appearance, no masses or tenderness Heart: regular rate and rhythm Abdomen: soft, non-tender; bowel sounds normal; no masses,  no organomegaly Extremities: extremities normal, atraumatic, no cyanosis or edema Skin: Skin color, texture, turgor normal. No rashes or lesions Lymph nodes: Cervical, supraclavicular, and axillary nodes normal. No abnormal inguinal nodes palpated Neurologic: Grossly normal   Pelvic: External genitalia:  no lesions              Urethra:  normal appearing urethra with no masses, tenderness or lesions              Bartholins and Skenes: normal                  Vagina: normal appearing vagina with normal color and discharge, no lesions              Cervix: no lesions              Pap taken: No. Bimanual Exam:  Uterus:  uterus absent              Adnexa: no mass, fullness, tenderness               Rectovaginal: Confirms               Anus:  normal sphincter tone, no lesions  Chaperone was present for exam.  A:  Well Woman with normal exam  Family hx of breast cancer--2 sisters  H/O TVH with vault repair with mesh 10/10 Mild OAB.  No medications needed. Hypertension  Osteopenia. Stopped Evista last year due to age.  P: Mammogram yearly. D/W pt 3D due to breast density. No Pap needed  Plan BMD in two years from last one  Labs with PCP Pt considering having genetic testing.  Will let me know if desires an appt.   return annually or prn

## 2015-08-16 ENCOUNTER — Ambulatory Visit
Admission: RE | Admit: 2015-08-16 | Discharge: 2015-08-16 | Disposition: A | Discharge status: Home / Self Care | Payer: Medicare Other | Source: Ambulatory Visit

## 2015-08-16 DIAGNOSIS — Z1231 Encounter for screening mammogram for malignant neoplasm of breast: Secondary | ICD-10-CM | POA: Diagnosis not present

## 2015-08-17 ENCOUNTER — Other Ambulatory Visit: Payer: Self-pay | Admitting: Internal Medicine

## 2015-08-17 DIAGNOSIS — R928 Other abnormal and inconclusive findings on diagnostic imaging of breast: Secondary | ICD-10-CM

## 2015-08-24 ENCOUNTER — Ambulatory Visit
Admission: RE | Admit: 2015-08-24 | Discharge: 2015-08-24 | Disposition: A | Discharge status: Home / Self Care | Payer: Medicare Other | Source: Ambulatory Visit | Attending: Internal Medicine | Admitting: Internal Medicine

## 2015-08-24 DIAGNOSIS — N6489 Other specified disorders of breast: Secondary | ICD-10-CM | POA: Diagnosis not present

## 2015-08-24 DIAGNOSIS — R928 Other abnormal and inconclusive findings on diagnostic imaging of breast: Secondary | ICD-10-CM

## 2015-08-24 DIAGNOSIS — R922 Inconclusive mammogram: Secondary | ICD-10-CM | POA: Diagnosis not present

## 2015-10-15 ENCOUNTER — Telehealth: Payer: Self-pay | Admitting: Emergency Medicine

## 2015-10-15 NOTE — Telephone Encounter (Signed)
-----   Message from Megan Salon, MD sent at 10/15/2015  6:27 AM EST ----- Regarding: RE: Mammogram hold  Yes, out of MMG hold.  MSM ----- Message -----    From: Michele Mcalpine, RN    Sent: 10/14/2015   5:30 PM      To: Megan Salon, MD Subject: Mammogram hold                                 Dr. Sabra Heck,  Patient in mammogram hold.  Final imaging sent to PCP. Okay to remove from mammogram hold?

## 2015-10-15 NOTE — Telephone Encounter (Signed)
Out of hold per Dr. Miller.   

## 2015-11-11 DIAGNOSIS — Z1389 Encounter for screening for other disorder: Secondary | ICD-10-CM | POA: Diagnosis not present

## 2015-11-11 DIAGNOSIS — I10 Essential (primary) hypertension: Secondary | ICD-10-CM | POA: Diagnosis not present

## 2015-12-21 DIAGNOSIS — Z1211 Encounter for screening for malignant neoplasm of colon: Secondary | ICD-10-CM | POA: Diagnosis not present

## 2015-12-21 DIAGNOSIS — K5900 Constipation, unspecified: Secondary | ICD-10-CM | POA: Diagnosis not present

## 2015-12-21 DIAGNOSIS — K5730 Diverticulosis of large intestine without perforation or abscess without bleeding: Secondary | ICD-10-CM | POA: Diagnosis not present

## 2016-05-11 ENCOUNTER — Ambulatory Visit
Admission: RE | Admit: 2016-05-11 | Discharge: 2016-05-11 | Disposition: A | Discharge status: Home / Self Care | Payer: Medicare Other | Source: Ambulatory Visit | Attending: Internal Medicine | Admitting: Internal Medicine

## 2016-05-11 ENCOUNTER — Other Ambulatory Visit: Payer: Self-pay | Admitting: Internal Medicine

## 2016-05-11 DIAGNOSIS — R05 Cough: Secondary | ICD-10-CM

## 2016-05-11 DIAGNOSIS — I10 Essential (primary) hypertension: Secondary | ICD-10-CM | POA: Diagnosis not present

## 2016-05-11 DIAGNOSIS — R053 Chronic cough: Secondary | ICD-10-CM

## 2016-06-07 DIAGNOSIS — K5730 Diverticulosis of large intestine without perforation or abscess without bleeding: Secondary | ICD-10-CM | POA: Diagnosis not present

## 2016-06-07 DIAGNOSIS — Z1211 Encounter for screening for malignant neoplasm of colon: Secondary | ICD-10-CM | POA: Diagnosis not present

## 2016-06-12 DIAGNOSIS — H35033 Hypertensive retinopathy, bilateral: Secondary | ICD-10-CM | POA: Diagnosis not present

## 2016-06-16 ENCOUNTER — Other Ambulatory Visit: Payer: Self-pay | Admitting: Family Medicine

## 2016-06-16 DIAGNOSIS — Z1231 Encounter for screening mammogram for malignant neoplasm of breast: Secondary | ICD-10-CM

## 2016-07-03 DIAGNOSIS — Z23 Encounter for immunization: Secondary | ICD-10-CM | POA: Diagnosis not present

## 2016-07-03 DIAGNOSIS — H3530 Unspecified macular degeneration: Secondary | ICD-10-CM | POA: Diagnosis not present

## 2016-07-18 DIAGNOSIS — D2239 Melanocytic nevi of other parts of face: Secondary | ICD-10-CM | POA: Diagnosis not present

## 2016-07-18 DIAGNOSIS — I788 Other diseases of capillaries: Secondary | ICD-10-CM | POA: Diagnosis not present

## 2016-07-18 DIAGNOSIS — L814 Other melanin hyperpigmentation: Secondary | ICD-10-CM | POA: Diagnosis not present

## 2016-07-18 DIAGNOSIS — D225 Melanocytic nevi of trunk: Secondary | ICD-10-CM | POA: Diagnosis not present

## 2016-07-18 DIAGNOSIS — L821 Other seborrheic keratosis: Secondary | ICD-10-CM | POA: Diagnosis not present

## 2016-07-18 DIAGNOSIS — B351 Tinea unguium: Secondary | ICD-10-CM | POA: Diagnosis not present

## 2016-07-18 DIAGNOSIS — L918 Other hypertrophic disorders of the skin: Secondary | ICD-10-CM | POA: Diagnosis not present

## 2016-07-18 DIAGNOSIS — D1801 Hemangioma of skin and subcutaneous tissue: Secondary | ICD-10-CM | POA: Diagnosis not present

## 2016-07-18 DIAGNOSIS — L720 Epidermal cyst: Secondary | ICD-10-CM | POA: Diagnosis not present

## 2016-07-18 DIAGNOSIS — L738 Other specified follicular disorders: Secondary | ICD-10-CM | POA: Diagnosis not present

## 2016-07-25 ENCOUNTER — Telehealth: Payer: Self-pay | Admitting: Obstetrics & Gynecology

## 2016-07-25 NOTE — Telephone Encounter (Signed)
Left message regarding upcoming appointment has been canceled and needs to be rescheduled. °

## 2016-08-14 ENCOUNTER — Encounter: Payer: Self-pay | Admitting: Obstetrics & Gynecology

## 2016-08-14 ENCOUNTER — Ambulatory Visit (INDEPENDENT_AMBULATORY_CARE_PROVIDER_SITE_OTHER): Payer: Medicare Other | Admitting: Obstetrics & Gynecology

## 2016-08-14 VITALS — BP 122/76 | HR 56 | Resp 12 | Ht 61.5 in | Wt 115.4 lb

## 2016-08-14 DIAGNOSIS — Z01419 Encounter for gynecological examination (general) (routine) without abnormal findings: Secondary | ICD-10-CM

## 2016-08-14 DIAGNOSIS — I10 Essential (primary) hypertension: Secondary | ICD-10-CM

## 2016-08-14 DIAGNOSIS — Z124 Encounter for screening for malignant neoplasm of cervix: Secondary | ICD-10-CM | POA: Diagnosis not present

## 2016-08-14 NOTE — Progress Notes (Signed)
75 y.o. [id number] MarriedCaucasianF here for annual exam.  Last year, pt and I discussed possible genetic testing due to having two sisters with breast cancer.  Younger sister did have genetic testing last year and it was negative.  Pt does not want to have this done due to her sister being negative.  Denies vaginal bleeding.   PCP:  Dr. Laurann Montana.    Patient's last menstrual period was 09/18/1989.          Sexually active: Yes.    The current method of family planning is status post hysterectomy.    Exercising: Yes.    walking, aerobics Smoker:  no  Health Maintenance: Pap:  2010 normal, hysterectomy History of abnormal Pap:  no MMG:  08/24/15 BIRADS 1 negative.  Has appt scheduled next week.   Colonoscopy:  09/17 with Dr. Collene Mares normal per patient- repeat 10 years BMD:   08/10/14 osteopenia- repeat 3-5 years TDaP:  11/2014 at [facility] Pneumonia vaccine(s):  10/19/13 Zostavax:   09/18/10 Hep C testing: not indicated  Screening Labs: PCP, Hb today: PCP, Urine today: PCP   reports that she has never smoked. She has never used smokeless tobacco. She reports that she does not drink alcohol or use drugs.  Past Medical History:  Diagnosis Date  . Cystocele   . Heart murmur    echocardiogram scheduled 04/11/13  . Hypertension   . Osteopenia   . Skin cancer     Past Surgical History:  Procedure Laterality Date  . TOTAL VAGINAL HYSTERECTOMY  10/10   with mesh cuff suspension    Current Outpatient Prescriptions  Medication Sig Dispense Refill  . aspirin EC 81 MG tablet Take 81 mg by mouth daily.    . Calcium Carbonate-Vitamin D (CALCIUM 600+D) 600-400 MG-UNIT per tablet Take 1 tablet by mouth daily.    . cholecalciferol (VITAMIN D) 1000 UNITS tablet Take 1,000 Units by mouth daily.    . fluticasone (FLONASE) 50 MCG/ACT nasal spray Place 1 spray into both nostrils daily.    . Multiple Vitamins-Minerals (ICAPS) CAPS Take by mouth daily.    . Omega-3 Fatty Acids (FISH OIL) 1200 MG CAPS Take  1 capsule by mouth daily.     . Psyllium (METAMUCIL PO) Take by mouth 2 (two) times daily.    Marland Kitchen triamterene-hydrochlorothiazide (MAXZIDE-25) 37.5-25 MG per tablet Take 0.5 tablets by mouth daily.    Marland Kitchen ketoconazole (NIZORAL) 2 % cream      No current facility-administered medications for this visit.     Family History  Problem Relation Age of Onset  . Cancer Sister 59    invasive ductal cancer-oldest and younger sister  . Hypertension Mother   . Hypertension Father   . CVA Paternal Grandfather   . Heart Problems Sister     oldest of 7    ROS:  Pertinent items are noted in HPI.  Otherwise, a comprehensive ROS was negative.  Exam:   BP 122/76 (BP Location: Right Arm, Patient Position: Sitting, Cuff Size: Normal)   Pulse (!) 56   Resp 12   Ht 5' 1.5" (1.562 m)   Wt 115 lb 6.4 oz (52.3 kg)   LMP 09/18/1989   BMI 21.45 kg/m   Weight change: +2#   Height: 5' 1.5" (156.2 cm)  Ht Readings from Last 3 Encounters:  08/14/16 5' 1.5" (1.562 m)  07/30/15 5\' 2"  (1.575 m)  06/12/14 5\' 2"  (1.575 m)    General appearance: alert, cooperative and appears stated age Head:  Normocephalic, without obvious abnormality, atraumatic Neck: no adenopathy, supple, symmetrical, trachea midline and thyroid normal to inspection and palpation Lungs: clear to auscultation bilaterally Breasts: normal appearance, no masses or tenderness Heart: regular rate and rhythm Abdomen: soft, non-tender; bowel sounds normal; no masses,  no organomegaly Extremities: extremities normal, atraumatic, no cyanosis or edema Skin: Skin color, texture, turgor normal. No rashes or lesions Lymph nodes: Cervical, supraclavicular, and axillary nodes normal. No abnormal inguinal nodes palpated Neurologic: Grossly normal  Pelvic: External genitalia:  no lesions              Urethra:  normal appearing urethra with no masses, tenderness or lesions              Bartholins and Skenes: normal                 Vagina: normal appearing  vagina with normal color and discharge, no lesions              Cervix: absent              Pap taken: No. Bimanual Exam:  Uterus:  uterus absent              Adnexa: no mass, fullness, tenderness               Rectovaginal: Confirms               Anus:  normal sphincter tone, no lesions  Chaperone was present for exam.  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 Doing 3D MMG due to family hx Plan BMD 2018-2019  Release of records for blood work from Dr. Laurann Montana Return annually or prn

## 2016-08-21 ENCOUNTER — Other Ambulatory Visit: Payer: Self-pay | Admitting: Internal Medicine

## 2016-08-21 ENCOUNTER — Ambulatory Visit
Admission: RE | Admit: 2016-08-21 | Discharge: 2016-08-21 | Disposition: A | Discharge status: Home / Self Care | Payer: Medicare Other | Source: Ambulatory Visit | Attending: Family Medicine | Admitting: Family Medicine

## 2016-08-21 DIAGNOSIS — Z1231 Encounter for screening mammogram for malignant neoplasm of breast: Secondary | ICD-10-CM

## 2016-11-10 DIAGNOSIS — Z0000 Encounter for general adult medical examination without abnormal findings: Secondary | ICD-10-CM | POA: Diagnosis not present

## 2016-11-10 DIAGNOSIS — I10 Essential (primary) hypertension: Secondary | ICD-10-CM | POA: Diagnosis not present

## 2016-11-10 DIAGNOSIS — Z1389 Encounter for screening for other disorder: Secondary | ICD-10-CM | POA: Diagnosis not present

## 2016-11-17 ENCOUNTER — Ambulatory Visit: Payer: Medicare Other | Admitting: Obstetrics & Gynecology

## 2017-03-30 DIAGNOSIS — J209 Acute bronchitis, unspecified: Secondary | ICD-10-CM | POA: Diagnosis not present

## 2017-03-30 DIAGNOSIS — J40 Bronchitis, not specified as acute or chronic: Secondary | ICD-10-CM | POA: Diagnosis not present

## 2017-05-14 DIAGNOSIS — I10 Essential (primary) hypertension: Secondary | ICD-10-CM | POA: Diagnosis not present

## 2017-06-18 DIAGNOSIS — H35363 Drusen (degenerative) of macula, bilateral: Secondary | ICD-10-CM | POA: Diagnosis not present

## 2017-06-18 DIAGNOSIS — H524 Presbyopia: Secondary | ICD-10-CM | POA: Diagnosis not present

## 2017-07-09 ENCOUNTER — Other Ambulatory Visit: Payer: Self-pay | Admitting: Internal Medicine

## 2017-07-09 DIAGNOSIS — Z1231 Encounter for screening mammogram for malignant neoplasm of breast: Secondary | ICD-10-CM

## 2017-07-13 DIAGNOSIS — D1801 Hemangioma of skin and subcutaneous tissue: Secondary | ICD-10-CM | POA: Diagnosis not present

## 2017-07-13 DIAGNOSIS — D224 Melanocytic nevi of scalp and neck: Secondary | ICD-10-CM | POA: Diagnosis not present

## 2017-07-13 DIAGNOSIS — B351 Tinea unguium: Secondary | ICD-10-CM | POA: Diagnosis not present

## 2017-07-13 DIAGNOSIS — L918 Other hypertrophic disorders of the skin: Secondary | ICD-10-CM | POA: Diagnosis not present

## 2017-07-13 DIAGNOSIS — D225 Melanocytic nevi of trunk: Secondary | ICD-10-CM | POA: Diagnosis not present

## 2017-07-13 DIAGNOSIS — Z85828 Personal history of other malignant neoplasm of skin: Secondary | ICD-10-CM | POA: Diagnosis not present

## 2017-07-13 DIAGNOSIS — L821 Other seborrheic keratosis: Secondary | ICD-10-CM | POA: Diagnosis not present

## 2017-08-20 ENCOUNTER — Other Ambulatory Visit: Payer: Self-pay

## 2017-08-20 ENCOUNTER — Encounter: Payer: Self-pay | Admitting: Obstetrics & Gynecology

## 2017-08-20 ENCOUNTER — Ambulatory Visit (INDEPENDENT_AMBULATORY_CARE_PROVIDER_SITE_OTHER): Payer: Medicare Other | Admitting: Obstetrics & Gynecology

## 2017-08-20 VITALS — BP 132/70 | HR 58 | Resp 12 | Ht 61.5 in | Wt 114.5 lb

## 2017-08-20 DIAGNOSIS — M85859 Other specified disorders of bone density and structure, unspecified thigh: Secondary | ICD-10-CM | POA: Diagnosis not present

## 2017-08-20 DIAGNOSIS — Z124 Encounter for screening for malignant neoplasm of cervix: Secondary | ICD-10-CM

## 2017-08-20 DIAGNOSIS — Z8349 Family history of other endocrine, nutritional and metabolic diseases: Secondary | ICD-10-CM | POA: Diagnosis not present

## 2017-08-20 DIAGNOSIS — E559 Vitamin D deficiency, unspecified: Secondary | ICD-10-CM | POA: Diagnosis not present

## 2017-08-20 DIAGNOSIS — I10 Essential (primary) hypertension: Secondary | ICD-10-CM

## 2017-08-20 DIAGNOSIS — Z01419 Encounter for gynecological examination (general) (routine) without abnormal findings: Secondary | ICD-10-CM

## 2017-08-20 NOTE — Progress Notes (Signed)
76 y.o. [id number] MarriedCaucasianF here for annual exam.  Doing well.  Denies vaginal bleeding.  Having to get up once a night to void.  Does not leak urine.    Has a son who is an alcoholic.  He recently checked into another rehab.  Daughter is very frustrated with her brother.  They just do not have much of a relationship.  PCP:  Dr. Laurann Montana.    Patient's last menstrual period was 09/18/1989.          Sexually active: Yes.    The current method of family planning is status post hysterectomy.    Exercising: No.  The patient does not participate in regular exercise at present. Smoker:  no  Health Maintenance: Pap:  2010 normal, hysterectomy  History of abnormal Pap:  no MMG:  08/21/16 BIRADS 1 negative   Colonoscopy:  9/17 Dr. Collene Mares, repeat 10 years BMD:   08/10/14 osteopenia   TDaP:  11/2014 at [place]  Pneumonia vaccine(s):  10/19/13  Zostavax:   09/18/10, shingrix--has done one Hep C testing: not indicated  Screening Labs: PCP, Hb today: PCP, Urine today: not collected    reports that  has never smoked. she has never used smokeless tobacco. She reports that she does not drink alcohol or use drugs.  Past Medical History:  Diagnosis Date  . Cystocele   . Heart murmur    echocardiogram scheduled 04/11/13  . Hypertension   . Osteopenia   . Skin cancer     Past Surgical History:  Procedure Laterality Date  . TOTAL VAGINAL HYSTERECTOMY  10/10   with mesh cuff suspension    Current Outpatient Medications  Medication Sig Dispense Refill  . aspirin EC 81 MG tablet Take 81 mg by mouth daily.    . Calcium Carbonate-Vitamin D (CALCIUM 600+D) 600-400 MG-UNIT per tablet Take 1 tablet by mouth daily.    . cholecalciferol (VITAMIN D) 1000 UNITS tablet Take 1,000 Units by mouth daily.    Marland Kitchen DOCUSATE CALCIUM PO Take 2 tablets by mouth daily.    . Multiple Vitamins-Minerals (ICAPS) CAPS Take by mouth daily.    . Omega-3 Fatty Acids (FISH OIL) 1200 MG CAPS Take 1 capsule by mouth daily.     .  Psyllium (METAMUCIL PO) Take by mouth 2 (two) times daily.    . Triamcinolone Acetonide (NASACORT AQ NA) Place into the nose.    . triamterene-hydrochlorothiazide (MAXZIDE-25) 37.5-25 MG per tablet Take 0.5 tablets by mouth daily.     No current facility-administered medications for this visit.     Family History  Problem Relation Age of Onset  . Cancer Sister 21       invasive ductal cancer-oldest and younger sister  . Hypertension Mother   . Hypertension Father   . CVA Paternal Grandfather   . Heart Problems Sister        oldest of 7    ROS:  Pertinent items are noted in HPI.  Otherwise, a comprehensive ROS was negative.  Exam:   BP 132/70 (BP Location: Right Arm, Patient Position: Sitting, Cuff Size: Normal)   Pulse (!) 58   Resp 12   Ht 5' 1.5" (1.562 m)   Wt 114 lb 8 oz (51.9 kg)   LMP 09/18/1989   BMI 21.28 kg/m      Height: 5' 1.5" (156.2 cm)  Ht Readings from Last 3 Encounters:  08/20/17 5' 1.5" (1.562 m)  08/14/16 5' 1.5" (1.562 m)  07/30/15 5\' 2"  (1.575 m)  General appearance: alert, cooperative and appears stated age Head: Normocephalic, without obvious abnormality, atraumatic Neck: no adenopathy, supple, symmetrical, trachea midline and thyroid normal to inspection and palpation Lungs: clear to auscultation bilaterally Breasts: normal appearance, no masses or tenderness Heart: regular rate and rhythm Abdomen: soft, non-tender; bowel sounds normal; no masses,  no organomegaly Extremities: extremities normal, atraumatic, no cyanosis or edema Skin: Skin color, texture, turgor normal. No rashes or lesions Lymph nodes: Cervical, supraclavicular, and axillary nodes normal. No abnormal inguinal nodes palpated Neurologic: Grossly normal   Pelvic: External genitalia:  no lesions              Urethra:  normal appearing urethra with no masses, tenderness or lesions              Bartholins and Skenes: normal                 Vagina: normal appearing vagina with  normal color and discharge, no lesions              Cervix: absent              Pap taken: No. Bimanual Exam:  Uterus:  uterus absent              Adnexa: no mass, fullness, tenderness               Rectovaginal: Confirms               Anus:  normal sphincter tone, no lesions  Chaperone was present for exam.  A:  Well Woman with normal exam Family hx of breast cancer in 2 sisters H/O TVH with vault repair and mesh 10/10 Mild OAB with nocturia Hypertension Osteopenia, stopped Evista 2015 Family hx of thyroid disease in two sisters  P:   Mammogram guidelines reviewed pap smear not indicated CBC, CMP, Lipids, Vit D, TSH  Plan BMD next year as she's been off the Evista for three years return annually or prn

## 2017-08-21 LAB — CBC
HEMATOCRIT: 40.8 % (ref 34.0–46.6)
HEMOGLOBIN: 13.3 g/dL (ref 11.1–15.9)
MCH: 30.3 pg (ref 26.6–33.0)
MCHC: 32.6 g/dL (ref 31.5–35.7)
MCV: 93 fL (ref 79–97)
Platelets: 257 10*3/uL (ref 150–379)
RBC: 4.39 x10E6/uL (ref 3.77–5.28)
RDW: 13.3 % (ref 12.3–15.4)
WBC: 5.9 10*3/uL (ref 3.4–10.8)

## 2017-08-21 LAB — COMPREHENSIVE METABOLIC PANEL
A/G RATIO: 1.7 (ref 1.2–2.2)
ALK PHOS: 77 IU/L (ref 39–117)
ALT: 18 IU/L (ref 0–32)
AST: 28 IU/L (ref 0–40)
Albumin: 4.6 g/dL (ref 3.5–4.8)
BUN/Creatinine Ratio: 17 (ref 12–28)
BUN: 14 mg/dL (ref 8–27)
Bilirubin Total: 0.6 mg/dL (ref 0.0–1.2)
CALCIUM: 10.2 mg/dL (ref 8.7–10.3)
CHLORIDE: 99 mmol/L (ref 96–106)
CO2: 28 mmol/L (ref 20–29)
Creatinine, Ser: 0.82 mg/dL (ref 0.57–1.00)
GFR calc Af Amer: 81 mL/min/{1.73_m2} (ref >59)
GFR, EST NON AFRICAN AMERICAN: 70 mL/min/{1.73_m2} (ref >59)
Globulin, Total: 2.7 g/dL (ref 1.5–4.5)
Glucose: 82 mg/dL (ref 65–99)
Potassium: 4.2 mmol/L (ref 3.5–5.2)
Sodium: 140 mmol/L (ref 134–144)
Total Protein: 7.3 g/dL (ref 6.0–8.5)

## 2017-08-21 LAB — LIPID PANEL
Chol/HDL Ratio: 2.6 ratio (ref 0.0–4.4)
Cholesterol, Total: 201 mg/dL — ABNORMAL HIGH (ref 100–199)
HDL: 77 mg/dL (ref >39)
LDL CALC: 107 mg/dL — AB (ref 0–99)
Triglycerides: 85 mg/dL (ref 0–149)
VLDL CHOLESTEROL CAL: 17 mg/dL (ref 5–40)

## 2017-08-21 LAB — VITAMIN D 25 HYDROXY (VIT D DEFICIENCY, FRACTURES): VIT D 25 HYDROXY: 62.8 ng/mL (ref 30.0–100.0)

## 2017-08-21 LAB — TSH: TSH: 1.31 u[IU]/mL (ref 0.450–4.500)

## 2017-08-22 ENCOUNTER — Ambulatory Visit
Admission: RE | Admit: 2017-08-22 | Discharge: 2017-08-22 | Disposition: A | Discharge status: Home / Self Care | Payer: Medicare Other | Source: Ambulatory Visit | Attending: Internal Medicine | Admitting: Internal Medicine

## 2017-08-22 DIAGNOSIS — Z1231 Encounter for screening mammogram for malignant neoplasm of breast: Secondary | ICD-10-CM

## 2017-09-27 DIAGNOSIS — J209 Acute bronchitis, unspecified: Secondary | ICD-10-CM | POA: Diagnosis not present

## 2017-11-27 DIAGNOSIS — I10 Essential (primary) hypertension: Secondary | ICD-10-CM | POA: Diagnosis not present

## 2017-11-27 DIAGNOSIS — Z0000 Encounter for general adult medical examination without abnormal findings: Secondary | ICD-10-CM | POA: Diagnosis not present

## 2017-11-27 DIAGNOSIS — Z1389 Encounter for screening for other disorder: Secondary | ICD-10-CM | POA: Diagnosis not present

## 2017-11-27 DIAGNOSIS — M8580 Other specified disorders of bone density and structure, unspecified site: Secondary | ICD-10-CM | POA: Diagnosis not present

## 2018-03-12 IMAGING — CR DG CHEST 2V
2 series · 2 of 2 positions shown · non-contrast
Comparison: None in PACs

CLINICAL DATA: Nonproductive cough for 5 years with no known
injury; history of hypertension, nonsmoker.

EXAM:
CHEST  2 VIEW

[w chest pa]
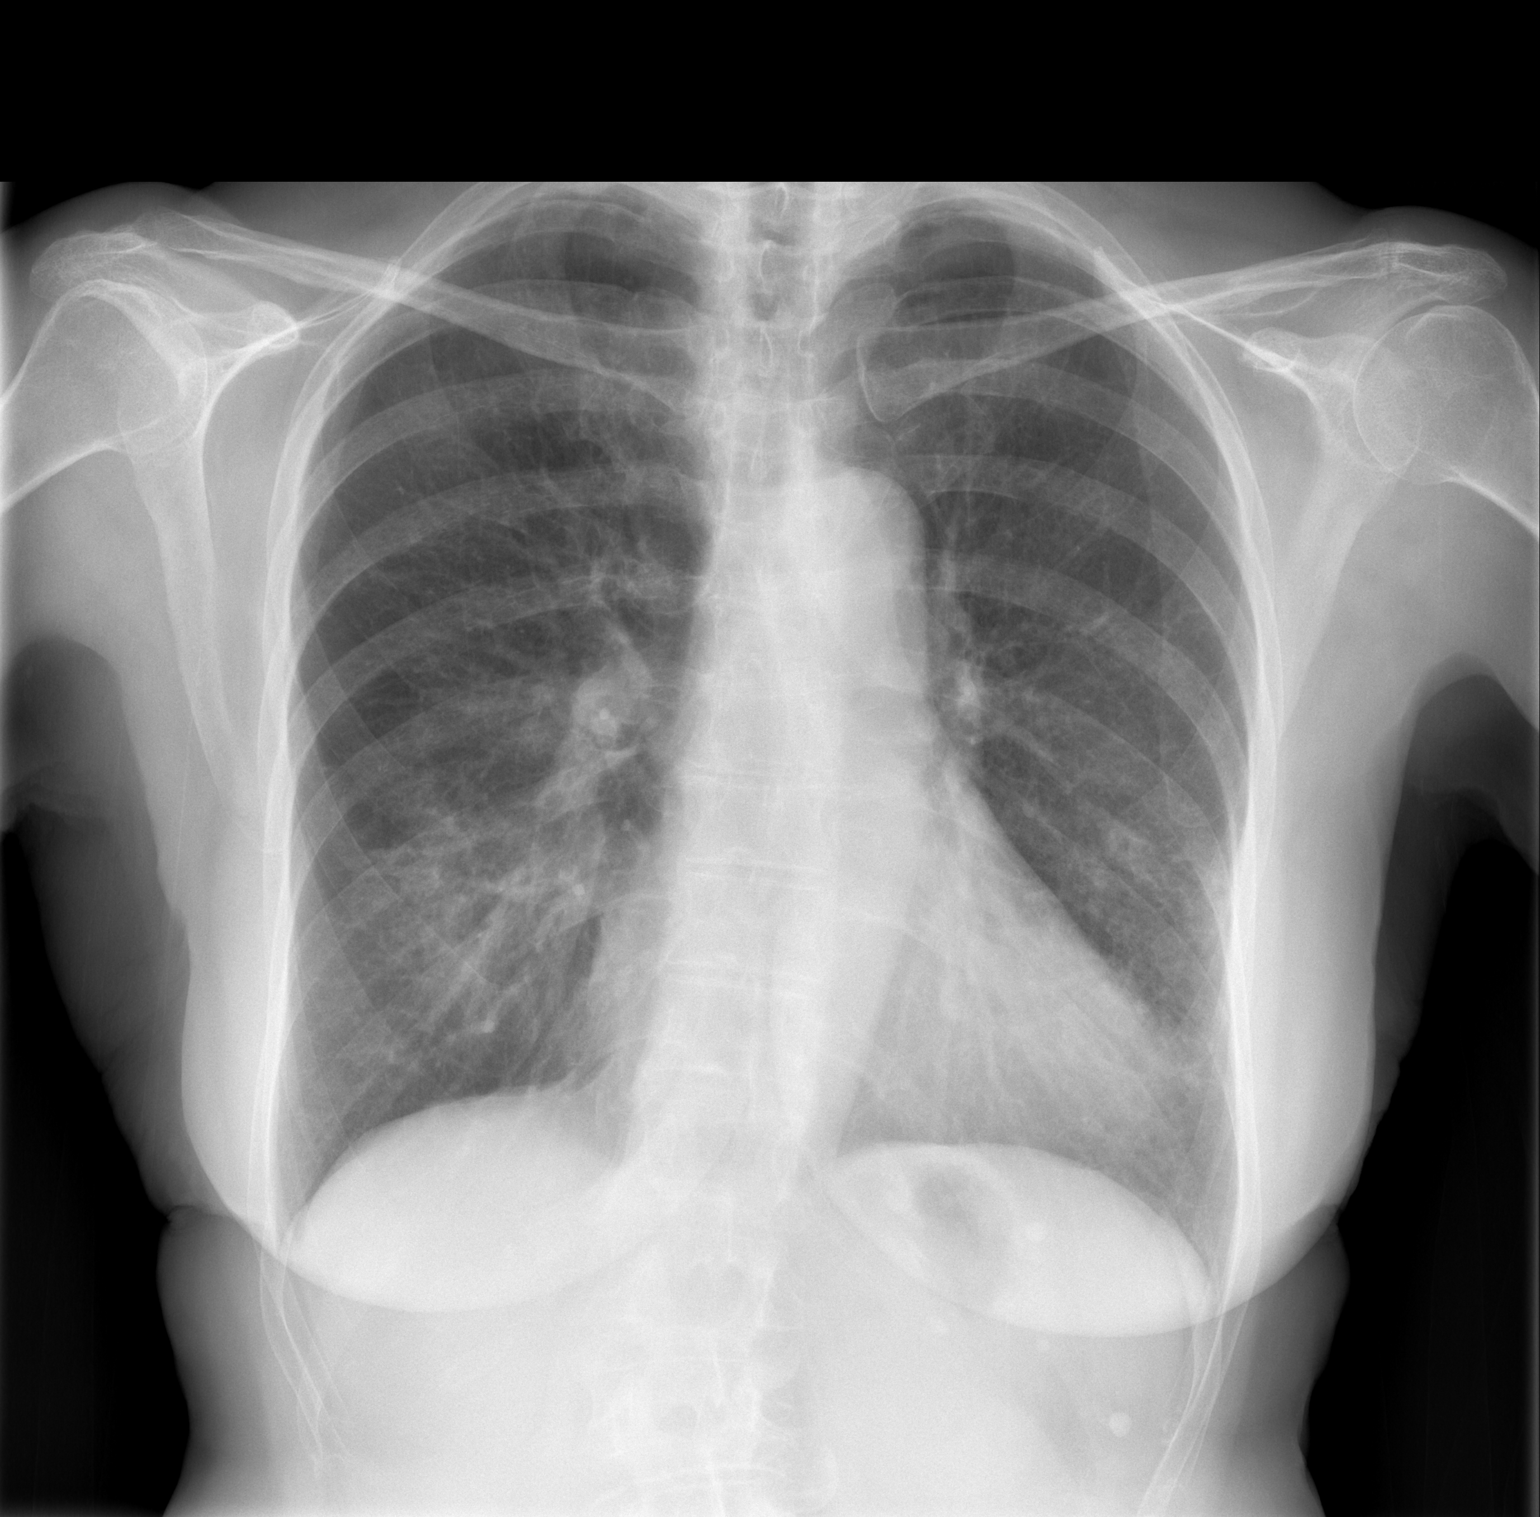

[w chest lat]
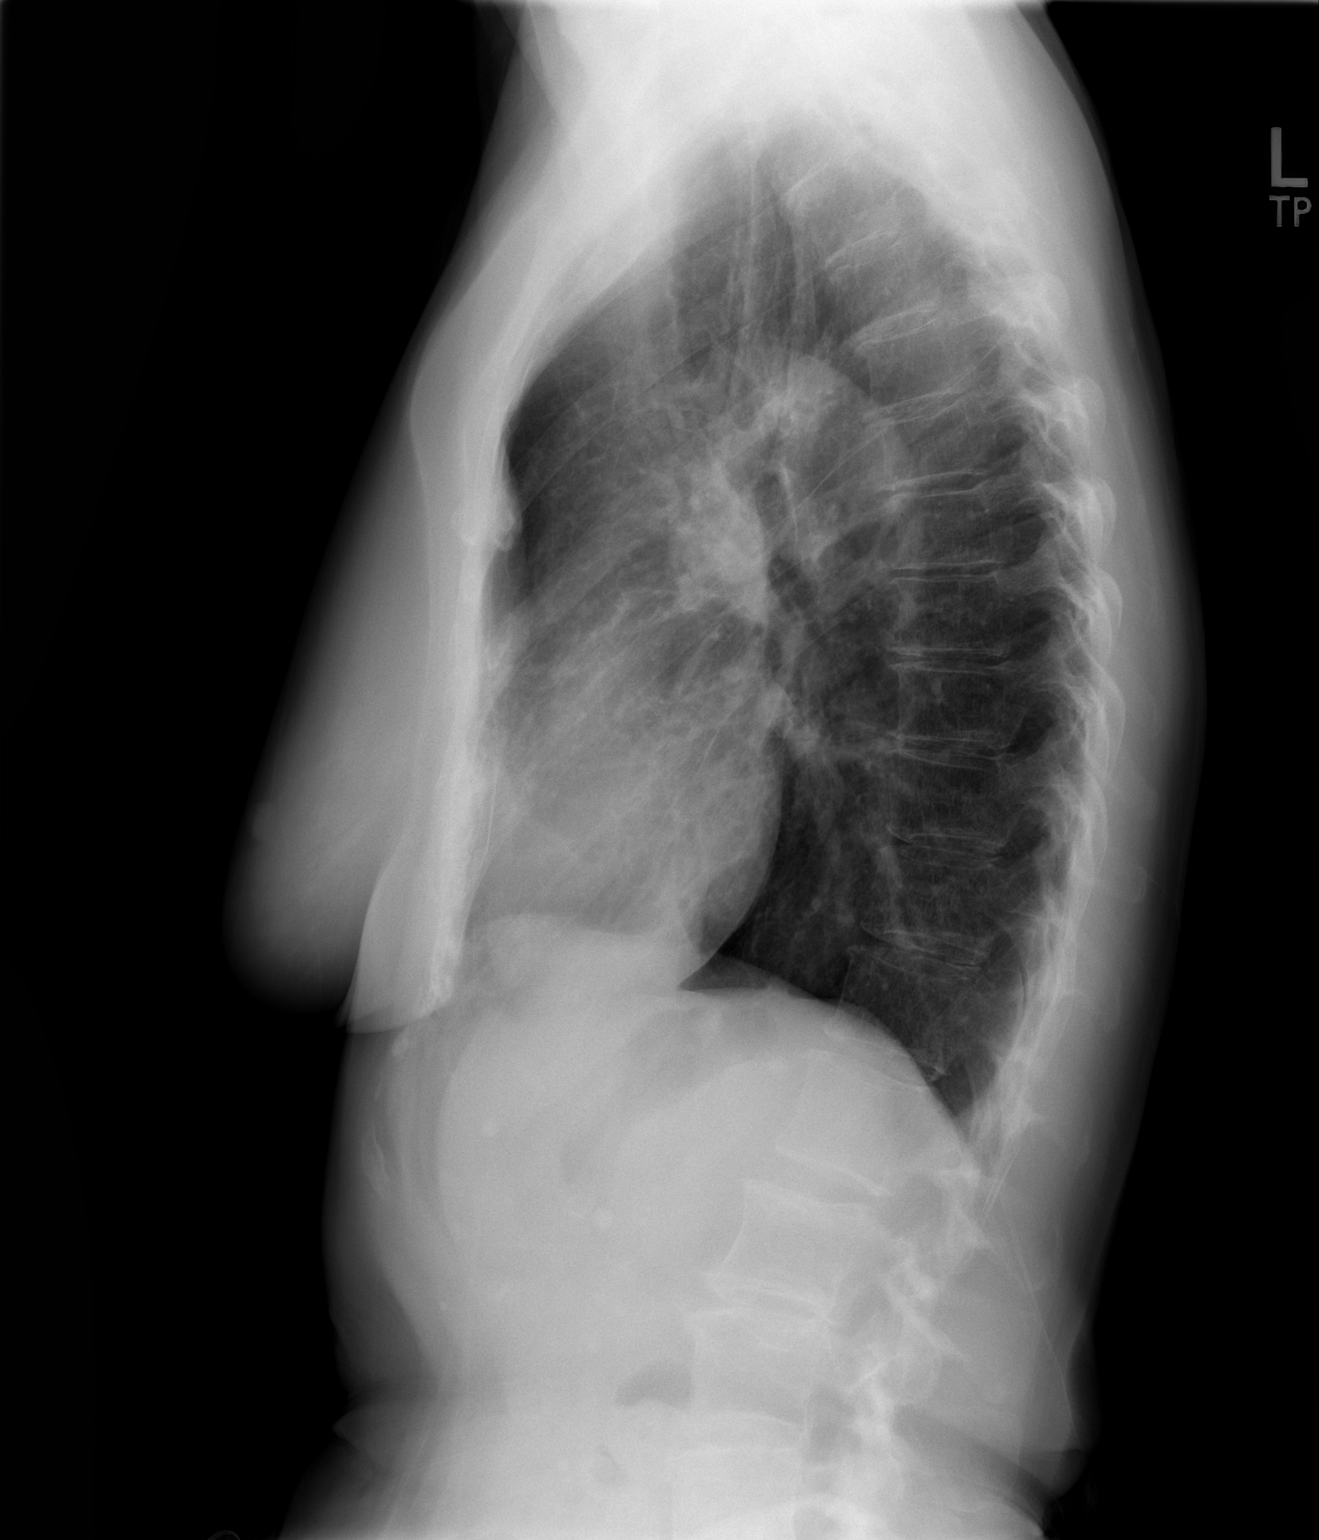

[2 of 2 positions shown; findings below may reference images not displayed]

FINDINGS: The lungs are well-expanded. There is no focal infiltrate. The
interstitial markings are coarse. The heart and pulmonary
vascularity are normal. The mediastinum is normal in width. There is
mild multilevel degenerative disc disease. There is a mild pectus
excavatum type chest contour.
IMPRESSION: Coarse interstitial lung markings bilaterally likely reflects
chronic bronchitic change. There is no alveolar pneumonia nor CHF.

## 2018-06-14 DIAGNOSIS — I10 Essential (primary) hypertension: Secondary | ICD-10-CM | POA: Diagnosis not present

## 2018-06-24 DIAGNOSIS — Z23 Encounter for immunization: Secondary | ICD-10-CM | POA: Diagnosis not present

## 2018-06-24 DIAGNOSIS — H35033 Hypertensive retinopathy, bilateral: Secondary | ICD-10-CM | POA: Diagnosis not present

## 2018-06-24 DIAGNOSIS — H35363 Drusen (degenerative) of macula, bilateral: Secondary | ICD-10-CM | POA: Diagnosis not present

## 2018-07-10 ENCOUNTER — Other Ambulatory Visit: Payer: Self-pay | Admitting: Internal Medicine

## 2018-07-10 DIAGNOSIS — Z1231 Encounter for screening mammogram for malignant neoplasm of breast: Secondary | ICD-10-CM

## 2018-07-17 DIAGNOSIS — D225 Melanocytic nevi of trunk: Secondary | ICD-10-CM | POA: Diagnosis not present

## 2018-07-17 DIAGNOSIS — D1801 Hemangioma of skin and subcutaneous tissue: Secondary | ICD-10-CM | POA: Diagnosis not present

## 2018-07-17 DIAGNOSIS — D2271 Melanocytic nevi of right lower limb, including hip: Secondary | ICD-10-CM | POA: Diagnosis not present

## 2018-07-17 DIAGNOSIS — D2262 Melanocytic nevi of left upper limb, including shoulder: Secondary | ICD-10-CM | POA: Diagnosis not present

## 2018-07-17 DIAGNOSIS — D2272 Melanocytic nevi of left lower limb, including hip: Secondary | ICD-10-CM | POA: Diagnosis not present

## 2018-07-17 DIAGNOSIS — L905 Scar conditions and fibrosis of skin: Secondary | ICD-10-CM | POA: Diagnosis not present

## 2018-07-17 DIAGNOSIS — B351 Tinea unguium: Secondary | ICD-10-CM | POA: Diagnosis not present

## 2018-07-17 DIAGNOSIS — L718 Other rosacea: Secondary | ICD-10-CM | POA: Diagnosis not present

## 2018-07-17 DIAGNOSIS — L918 Other hypertrophic disorders of the skin: Secondary | ICD-10-CM | POA: Diagnosis not present

## 2018-07-17 DIAGNOSIS — D2239 Melanocytic nevi of other parts of face: Secondary | ICD-10-CM | POA: Diagnosis not present

## 2018-08-23 ENCOUNTER — Ambulatory Visit
Admission: RE | Admit: 2018-08-23 | Discharge: 2018-08-23 | Disposition: A | Discharge status: Home / Self Care | Payer: Medicare Other | Source: Ambulatory Visit | Attending: Internal Medicine | Admitting: Internal Medicine

## 2018-08-23 DIAGNOSIS — Z1231 Encounter for screening mammogram for malignant neoplasm of breast: Secondary | ICD-10-CM | POA: Diagnosis not present

## 2018-10-31 ENCOUNTER — Other Ambulatory Visit: Payer: Self-pay

## 2018-10-31 ENCOUNTER — Encounter: Payer: Self-pay | Admitting: Obstetrics & Gynecology

## 2018-10-31 ENCOUNTER — Ambulatory Visit (INDEPENDENT_AMBULATORY_CARE_PROVIDER_SITE_OTHER): Payer: Medicare Other | Admitting: Obstetrics & Gynecology

## 2018-10-31 VITALS — BP 140/90 | HR 60 | Resp 14 | Ht 61.5 in | Wt 118.0 lb

## 2018-10-31 DIAGNOSIS — Z124 Encounter for screening for malignant neoplasm of cervix: Secondary | ICD-10-CM | POA: Diagnosis not present

## 2018-10-31 DIAGNOSIS — Z01419 Encounter for gynecological examination (general) (routine) without abnormal findings: Secondary | ICD-10-CM | POA: Diagnosis not present

## 2018-10-31 DIAGNOSIS — E2839 Other primary ovarian failure: Secondary | ICD-10-CM

## 2018-10-31 DIAGNOSIS — M8580 Other specified disorders of bone density and structure, unspecified site: Secondary | ICD-10-CM

## 2018-10-31 NOTE — Progress Notes (Signed)
78 y.o. G34P2002 Married White or Caucasian female here for annual exam.  Has a URI today.  Does have some cough and nasal drainage.   Denies vaginal bleeding.    Not feeling any bladder prolapse.  Occasionally has urinary incontinence if coughs a lot or waits too long.    PCP:  Dr. Laurann Montana.  Has appt in April, 2020.  Patient's last menstrual period was 09/18/1989.          Sexually active: Yes.    The current method of family planning is status post hysterectomy.    Exercising: Yes.    walking, aerobics Smoker:  no  Health Maintenance: Pap:  2010 Normal  History of abnormal Pap:  no MMG:  08/23/18 BIRADS1:neg  Colonoscopy:  2017 f/u 10 years  BMD:   08/10/14 osteopenia, -1.1 TDaP:  2016 Pneumonia vaccine(s):  2015 Shingrix:   Completed 11/20/17 Hep C testing: n/a Screening Labs: 12/18   reports that she has never smoked. She has never used smokeless tobacco. She reports that she does not drink alcohol or use drugs.  Past Medical History:  Diagnosis Date  . Cystocele   . Heart murmur    echocardiogram scheduled 04/11/13  . Hypertension   . Osteopenia   . Skin cancer     Past Surgical History:  Procedure Laterality Date  . TOTAL VAGINAL HYSTERECTOMY  10/10   with mesh cuff suspension    Current Outpatient Medications  Medication Sig Dispense Refill  . Calcium Carbonate-Vitamin D (CALCIUM 600+D) 600-400 MG-UNIT per tablet Take 2 tablets by mouth daily.     . Cholecalciferol (VITAMIN D3) 20 MCG (800 UNIT) TABS Take 2 tablets by mouth daily.    Marland Kitchen DOCUSATE CALCIUM PO Take 2 tablets by mouth daily.    Marland Kitchen loratadine (ALLERGY) 10 MG tablet Take 10 mg by mouth daily.    . Multiple Vitamins-Minerals (ICAPS) CAPS Take by mouth daily.    . Omega-3 Fatty Acids (FISH OIL) 1200 MG CAPS Take 1 capsule by mouth daily.     . Psyllium (METAMUCIL PO) Take by mouth daily.     . Triamcinolone Acetonide (NASACORT AQ NA) Place into the nose.    . triamterene-hydrochlorothiazide (MAXZIDE-25)  37.5-25 MG per tablet Take 0.5 tablets by mouth daily.     No current facility-administered medications for this visit.     Family History  Problem Relation Age of Onset  . Cancer Sister 19       invasive ductal cancer-oldest and younger sister  . Hypertension Mother   . Hypertension Father   . CVA Paternal Grandfather   . Heart Problems Sister        oldest of 7    Review of Systems  Endocrine: Positive for cold intolerance and heat intolerance.  Genitourinary:       Loss of urine with sneeze or cough  Night urination   All other systems reviewed and are negative.   Exam:   BP 140/90 (BP Location: Left Arm, Patient Position: Sitting, Cuff Size: Normal)   Pulse 60   Resp 14   Ht 5' 1.5" (1.562 m)   Wt 118 lb (53.5 kg)   LMP 09/18/1989   BMI 21.93 kg/m   Height:   Height: 5' 1.5" (156.2 cm)  Ht Readings from Last 3 Encounters:  10/31/18 5' 1.5" (1.562 m)  08/20/17 5' 1.5" (1.562 m)  08/14/16 5' 1.5" (1.562 m)    General appearance: alert, cooperative and appears stated age Head: Normocephalic, without  obvious abnormality, atraumatic Neck: no adenopathy, supple, symmetrical, trachea midline and thyroid normal to inspection and palpation Lungs: clear to auscultation bilaterally Breasts: normal appearance, no masses or tenderness Heart: regular rate and rhythm Abdomen: soft, non-tender; bowel sounds normal; no masses,  no organomegaly Extremities: extremities normal, atraumatic, no cyanosis or edema Skin: Skin color, texture, turgor normal. No rashes or lesions Lymph nodes: Cervical, supraclavicular, and axillary nodes normal. No abnormal inguinal nodes palpated Neurologic: Grossly normal  Pelvic: External genitalia:  no lesions              Urethra:  normal appearing urethra with no masses, tenderness or lesions              Bartholins and Skenes: normal                 Vagina: normal appearing vagina with normal color and discharge, no lesions               Cervix: absent              Pap taken: No. Bimanual Exam:  Uterus:  uterus absent              Adnexa: no mass, fullness, tenderness               Rectovaginal: Confirms               Anus:  normal sphincter tone, no lesions  Chaperone was present for exam.  A:  Well Woman with normal exam PMP, no HRT Family hx of breast cancer in 2 sisters H/O TVH with vault repair and mesh 10/10 Hypertension Mild OAB Osteopenia, off Evista  P:   Mammogram guidelines reviewed.  Doing yearly. pap smear not indicated Plan BMD this year.  Order placed. Lab work was done last year.  Consider repeating next year. Vaccines are UTD Return annually or prn

## 2019-03-17 DIAGNOSIS — Z20828 Contact with and (suspected) exposure to other viral communicable diseases: Secondary | ICD-10-CM | POA: Diagnosis not present

## 2019-05-30 ENCOUNTER — Encounter: Payer: Self-pay | Admitting: Obstetrics & Gynecology

## 2019-05-30 DIAGNOSIS — Z0000 Encounter for general adult medical examination without abnormal findings: Secondary | ICD-10-CM | POA: Diagnosis not present

## 2019-05-30 DIAGNOSIS — Z1389 Encounter for screening for other disorder: Secondary | ICD-10-CM | POA: Diagnosis not present

## 2019-05-30 DIAGNOSIS — I10 Essential (primary) hypertension: Secondary | ICD-10-CM | POA: Diagnosis not present

## 2019-06-30 ENCOUNTER — Telehealth: Payer: Self-pay | Admitting: Obstetrics & Gynecology

## 2019-06-30 DIAGNOSIS — Z111 Encounter for screening for respiratory tuberculosis: Secondary | ICD-10-CM | POA: Diagnosis not present

## 2019-06-30 DIAGNOSIS — Z23 Encounter for immunization: Secondary | ICD-10-CM | POA: Diagnosis not present

## 2019-06-30 DIAGNOSIS — M8580 Other specified disorders of bone density and structure, unspecified site: Secondary | ICD-10-CM

## 2019-06-30 DIAGNOSIS — M8589 Other specified disorders of bone density and structure, multiple sites: Secondary | ICD-10-CM

## 2019-06-30 NOTE — Telephone Encounter (Signed)
[facility]

## 2019-06-30 NOTE — Telephone Encounter (Signed)
BMD order pended for review and co-sign by Dr. Sabra Heck.   Routing to provider for review.

## 2019-07-02 ENCOUNTER — Other Ambulatory Visit: Payer: Self-pay | Admitting: Obstetrics & Gynecology

## 2019-07-02 DIAGNOSIS — Z1231 Encounter for screening mammogram for malignant neoplasm of breast: Secondary | ICD-10-CM

## 2019-08-01 DIAGNOSIS — L814 Other melanin hyperpigmentation: Secondary | ICD-10-CM | POA: Diagnosis not present

## 2019-08-01 DIAGNOSIS — L718 Other rosacea: Secondary | ICD-10-CM | POA: Diagnosis not present

## 2019-08-01 DIAGNOSIS — B351 Tinea unguium: Secondary | ICD-10-CM | POA: Diagnosis not present

## 2019-08-01 DIAGNOSIS — D2262 Melanocytic nevi of left upper limb, including shoulder: Secondary | ICD-10-CM | POA: Diagnosis not present

## 2019-08-01 DIAGNOSIS — D225 Melanocytic nevi of trunk: Secondary | ICD-10-CM | POA: Diagnosis not present

## 2019-08-01 DIAGNOSIS — L821 Other seborrheic keratosis: Secondary | ICD-10-CM | POA: Diagnosis not present

## 2019-08-01 DIAGNOSIS — D1801 Hemangioma of skin and subcutaneous tissue: Secondary | ICD-10-CM | POA: Diagnosis not present

## 2019-09-23 ENCOUNTER — Ambulatory Visit
Admission: RE | Admit: 2019-09-23 | Discharge: 2019-09-23 | Disposition: A | Discharge status: Home / Self Care | Payer: Medicare Other | Source: Ambulatory Visit | Attending: Obstetrics & Gynecology | Admitting: Obstetrics & Gynecology

## 2019-09-23 ENCOUNTER — Other Ambulatory Visit: Payer: Self-pay

## 2019-09-23 DIAGNOSIS — Z1231 Encounter for screening mammogram for malignant neoplasm of breast: Secondary | ICD-10-CM

## 2019-09-23 DIAGNOSIS — M8580 Other specified disorders of bone density and structure, unspecified site: Secondary | ICD-10-CM

## 2019-09-23 DIAGNOSIS — M8589 Other specified disorders of bone density and structure, multiple sites: Secondary | ICD-10-CM

## 2019-09-29 ENCOUNTER — Ambulatory Visit: Payer: Medicare Other | Attending: Internal Medicine

## 2019-09-29 DIAGNOSIS — Z23 Encounter for immunization: Secondary | ICD-10-CM | POA: Diagnosis not present

## 2019-09-29 NOTE — Progress Notes (Signed)
   Covid-19 Vaccination Clinic  Name:  Rose Olson    MRN: [id number] DOB: May 23, 1941  09/29/2019  [name] was observed post Covid-19 immunization for 15 minutes without incidence. She was provided with Vaccine Information Sheet and instruction to access the V-Safe system.   [name] was instructed to call 911 with any severe reactions post vaccine: Marland Kitchen Difficulty breathing  . Swelling of your face and throat  . A fast heartbeat  . A bad rash all over your body  . Dizziness and weakness    Immunizations Administered    Name Date Dose VIS Date Route   Pfizer COVID-19 Vaccine 09/29/2019  8:54 AM 0.3 mL 08/29/2019 Intramuscular   Manufacturer: [place]   Lot: [id number]   [place]: [id number]

## 2019-10-19 ENCOUNTER — Ambulatory Visit: Payer: Medicare Other | Attending: Internal Medicine

## 2019-10-19 DIAGNOSIS — Z23 Encounter for immunization: Secondary | ICD-10-CM

## 2019-10-19 NOTE — Progress Notes (Signed)
   Covid-19 Vaccination Clinic  Name:  Rose Olson    MRN: [id number] DOB: [birth year]  10/19/2019  Rose Olson was observed post Covid-19 immunization for 15 minutes without incidence. She was provided with Vaccine Information Sheet and instruction to access the V-Safe system.   Rose Olson was instructed to call 911 with any severe reactions post vaccine: Marland Kitchen Difficulty breathing  . Swelling of your face and throat  . A fast heartbeat  . A bad rash all over your body  . Dizziness and weakness    Immunizations Administered    Name Date Dose VIS Date Route   Pfizer COVID-19 Vaccine 10/19/2019 12:45 AM 0.3 mL 08/29/2019 Intramuscular   Manufacturer: [place]    Lot: [id number]   [place]: [id number]

## 2019-10-24 DIAGNOSIS — M8580 Other specified disorders of bone density and structure, unspecified site: Secondary | ICD-10-CM | POA: Diagnosis not present

## 2019-11-14 ENCOUNTER — Other Ambulatory Visit: Payer: Self-pay

## 2019-11-17 ENCOUNTER — Encounter: Payer: Self-pay | Admitting: Obstetrics & Gynecology

## 2019-11-17 ENCOUNTER — Other Ambulatory Visit: Payer: Self-pay

## 2019-11-17 ENCOUNTER — Ambulatory Visit (INDEPENDENT_AMBULATORY_CARE_PROVIDER_SITE_OTHER): Payer: Medicare Other | Admitting: Obstetrics & Gynecology

## 2019-11-17 VITALS — BP 128/76 | HR 68 | Temp 97.9°F | Resp 10 | Ht 61.25 in | Wt 118.6 lb

## 2019-11-17 DIAGNOSIS — R9389 Abnormal findings on diagnostic imaging of other specified body structures: Secondary | ICD-10-CM

## 2019-11-17 DIAGNOSIS — Z124 Encounter for screening for malignant neoplasm of cervix: Secondary | ICD-10-CM

## 2019-11-17 DIAGNOSIS — Z9189 Other specified personal risk factors, not elsewhere classified: Secondary | ICD-10-CM | POA: Diagnosis not present

## 2019-11-17 DIAGNOSIS — M859 Disorder of bone density and structure, unspecified: Secondary | ICD-10-CM

## 2019-11-17 DIAGNOSIS — Z01419 Encounter for gynecological examination (general) (routine) without abnormal findings: Secondary | ICD-10-CM

## 2019-11-17 NOTE — Progress Notes (Signed)
79 y.o. G99P2002 Married White or Caucasian female here for annual exam.  Denies vaginal bleeding.   BMD results discussed with pt.  Dr. Laurann Montana recommended she consider Fosamax.  She's had two sisters who had jaw/teeth issues after taking it.  She declines Fosamax.  BMD worse T score is in right femoral neck and is -1.9.  FRAX scoring I did independently today and if I remove her mother's hx of hip fracture from the scoring, pt's FRAX hip fracture FRAX is 3.5%.  This is drastically less than the 14% FRAX score.  Pt is second of six siblings and none of these have had a hip fracture.  Her mother did not exercise and did not take vitamins.  Both pt and I feel her risk is not the same and her mother's was.  Will test for second causes today and then reassess.    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  Patient's last menstrual period was 09/18/1989.          Sexually active: Yes.    The current method of family planning is status post hysterectomy.    Exercising: Yes.    5-6 times a week Smoker:  no  Health Maintenance: Pap:  2010 Normal History of abnormal Pap:  no MMG:  09/23/19 BIRADS 1 negative/density b Colonoscopy:  2017 f/u 10 years  BMD:   09/23/19 Osteopenia.  See above discussion. TDaP:  2016 Pneumonia vaccine(s):  2015 Shingrix:   Completed  Hep C testing: n/a Screening Labs: PCP   reports that she has never smoked. She has never used smokeless tobacco. She reports that she does not drink alcohol or use drugs.  Past Medical History:  Diagnosis Date  . Cystocele   . Heart murmur    echocardiogram scheduled 04/11/13  . Hypertension   . Osteopenia   . Skin cancer     Past Surgical History:  Procedure Laterality Date  . TOTAL VAGINAL HYSTERECTOMY  10/10   with mesh cuff suspension    Current Outpatient Medications  Medication Sig Dispense Refill  . Calcium Carbonate-Vitamin D (CALCIUM 600+D) 600-400 MG-UNIT per tablet Take 2 tablets by mouth daily.     .  Cholecalciferol (VITAMIN D3) 20 MCG (800 UNIT) TABS Take 2 tablets by mouth daily.    Marland Kitchen DOCUSATE CALCIUM PO Take 2 tablets by mouth daily.    Marland Kitchen loratadine (ALLERGY) 10 MG tablet Take 10 mg by mouth daily.    . Multiple Vitamins-Minerals (ICAPS) CAPS Take by mouth daily.    . NON FORMULARY Trunature Vision Complex w/ Lutein 25mg , Zeaxanthin 5mg ; take 1 tablet every other day    . Omega-3 Fatty Acids (FISH OIL) 1200 MG CAPS Take 1 capsule by mouth daily.     . Psyllium (METAMUCIL PO) Take by mouth daily.     . Triamcinolone Acetonide (NASACORT AQ NA) Place into the nose.    . triamterene-hydrochlorothiazide (MAXZIDE-25) 37.5-25 MG per tablet Take 0.5 tablets by mouth daily.     No current facility-administered medications for this visit.    Family History  Problem Relation Age of Onset  . Cancer Sister 38       invasive ductal cancer-oldest and younger sister  . Hypertension Mother   . Hypertension Father   . CVA Paternal Grandfather   . Heart Problems Sister        oldest of 7    Review of Systems  All other systems reviewed and are negative.  Exam:   BP 128/76 (BP Location: Left Arm, Patient Position: Sitting, Cuff Size: Normal)   Pulse 68   Temp 97.9 F (36.6 C) (Temporal)   Resp 10   Ht 5' 1.25" (1.556 m)   Wt 118 lb 9.6 oz (53.8 kg)   LMP 09/18/1989   BMI 22.23 kg/m    Height: 5' 1.25" (155.6 cm)  Ht Readings from Last 3 Encounters:  11/17/19 5' 1.25" (1.556 m)  10/31/18 5' 1.5" (1.562 m)  08/20/17 5' 1.5" (1.562 m)   General appearance: alert, cooperative and appears stated age Head: Normocephalic, without obvious abnormality, atraumatic Neck: no adenopathy, supple, symmetrical, trachea midline and thyroid normal to inspection and palpation Lungs: clear to auscultation bilaterally Breasts: normal appearance, no masses or tenderness Heart: regular rate and rhythm Abdomen: soft, non-tender; bowel sounds normal; no masses,  no organomegaly Extremities: extremities  normal, atraumatic, no cyanosis or edema Skin: Skin color, texture, turgor normal. No rashes or lesions Lymph nodes: Cervical, supraclavicular, and axillary nodes normal. No abnormal inguinal nodes palpated Neurologic: Grossly normal   Pelvic: External genitalia:  no lesions              Urethra:  normal appearing urethra with no masses, tenderness or lesions              Bartholins and Skenes: normal                 Vagina: normal appearing vagina with normal color and discharge, no lesions              Cervix: absent              Pap taken: No. Bimanual Exam:  Uterus:  uterus absent              Adnexa: no mass, fullness, tenderness               Rectovaginal: Confirms               Anus:  normal sphincter tone, no lesions  Chaperone, Terence Lux, CMA, was present for exam.  A:  Well Woman with normal exam PMP, no HRT Family hx of breast cancer in 2 sisters H/o TVH with vault repair and mesh placement 10/10 Mild OAB Osteopenia, off Evista  P:   Mammogram guidelines reviewed.  Doing yearly. pap smear not indicated PTH, TSH and Vit D obtained today to assess for secondary causes of osteopenia Vaccines are UTD Colonoscopy UTD Return annually or prn

## 2019-11-18 LAB — TSH: TSH: 1.52 u[IU]/mL (ref 0.450–4.500)

## 2019-11-18 LAB — PTH, INTACT AND CALCIUM
Calcium: 10 mg/dL (ref 8.7–10.3)
PTH: 15 pg/mL (ref 15–65)

## 2019-11-18 LAB — VITAMIN D 25 HYDROXY (VIT D DEFICIENCY, FRACTURES): Vit D, 25-Hydroxy: 50.3 ng/mL (ref 30.0–100.0)

## 2019-11-20 ENCOUNTER — Telehealth: Payer: Self-pay

## 2019-11-20 NOTE — Telephone Encounter (Signed)
Tried calling patient with results as seen below. No answer, left message for patient to call me back.

## 2019-11-20 NOTE — Telephone Encounter (Signed)
-----   Message from Megan Salon, MD sent at 11/20/2019  6:53 AM EST ----- Please let pt know her PTH with calcium, TSH and Vit D were all good/normal.  She is contemplating starting medication for increased FRAX score with her BMD.  She has been on Evista in the past.  I feel it's ok to watch but her PCP wants her to start.  If I can help any further with her decision, please have her call.

## 2019-11-21 NOTE — Telephone Encounter (Signed)
Patient is returning a call to [facility]. She said you may leave details on her voice mail.

## 2019-11-24 NOTE — Telephone Encounter (Signed)
The following results were discussed in detail with patient as seen below. Patient states that she does not wish to start any treatment for BMD at this time. Will follow up with PCP with any future questions or concerns. Okay to close encounter.

## 2019-11-24 NOTE — Telephone Encounter (Signed)
Patient is returning call to [facility] Requesting return call on her cell phone at [phone]

## 2020-03-12 ENCOUNTER — Ambulatory Visit: Payer: Medicare Other | Admitting: Obstetrics & Gynecology

## 2020-06-14 DIAGNOSIS — Z23 Encounter for immunization: Secondary | ICD-10-CM | POA: Diagnosis not present

## 2020-06-30 ENCOUNTER — Other Ambulatory Visit: Payer: Self-pay | Admitting: Internal Medicine

## 2020-06-30 DIAGNOSIS — Z0000 Encounter for general adult medical examination without abnormal findings: Secondary | ICD-10-CM

## 2020-07-12 DIAGNOSIS — Z23 Encounter for immunization: Secondary | ICD-10-CM | POA: Diagnosis not present

## 2020-08-02 DIAGNOSIS — H35363 Drusen (degenerative) of macula, bilateral: Secondary | ICD-10-CM | POA: Diagnosis not present

## 2020-08-04 DIAGNOSIS — B351 Tinea unguium: Secondary | ICD-10-CM | POA: Diagnosis not present

## 2020-08-04 DIAGNOSIS — L821 Other seborrheic keratosis: Secondary | ICD-10-CM | POA: Diagnosis not present

## 2020-08-04 DIAGNOSIS — D2239 Melanocytic nevi of other parts of face: Secondary | ICD-10-CM | POA: Diagnosis not present

## 2020-08-04 DIAGNOSIS — D2272 Melanocytic nevi of left lower limb, including hip: Secondary | ICD-10-CM | POA: Diagnosis not present

## 2020-08-04 DIAGNOSIS — L720 Epidermal cyst: Secondary | ICD-10-CM | POA: Diagnosis not present

## 2020-08-04 DIAGNOSIS — D1801 Hemangioma of skin and subcutaneous tissue: Secondary | ICD-10-CM | POA: Diagnosis not present

## 2020-08-04 DIAGNOSIS — D2271 Melanocytic nevi of right lower limb, including hip: Secondary | ICD-10-CM | POA: Diagnosis not present

## 2020-08-04 DIAGNOSIS — D225 Melanocytic nevi of trunk: Secondary | ICD-10-CM | POA: Diagnosis not present

## 2020-08-05 DIAGNOSIS — Z1389 Encounter for screening for other disorder: Secondary | ICD-10-CM | POA: Diagnosis not present

## 2020-08-05 DIAGNOSIS — M8580 Other specified disorders of bone density and structure, unspecified site: Secondary | ICD-10-CM | POA: Diagnosis not present

## 2020-08-05 DIAGNOSIS — I10 Essential (primary) hypertension: Secondary | ICD-10-CM | POA: Diagnosis not present

## 2020-08-05 DIAGNOSIS — R350 Frequency of micturition: Secondary | ICD-10-CM | POA: Diagnosis not present

## 2020-08-05 DIAGNOSIS — Z0000 Encounter for general adult medical examination without abnormal findings: Secondary | ICD-10-CM | POA: Diagnosis not present

## 2020-08-09 ENCOUNTER — Ambulatory Visit (INDEPENDENT_AMBULATORY_CARE_PROVIDER_SITE_OTHER): Payer: Medicare Other | Admitting: Nurse Practitioner

## 2020-08-09 ENCOUNTER — Other Ambulatory Visit: Payer: Self-pay

## 2020-08-09 ENCOUNTER — Telehealth: Payer: Self-pay

## 2020-08-09 ENCOUNTER — Encounter: Payer: Self-pay | Admitting: Nurse Practitioner

## 2020-08-09 VITALS — BP 118/78 | HR 70 | Temp 97.9°F | Resp 16 | Wt 120.0 lb

## 2020-08-09 DIAGNOSIS — N390 Urinary tract infection, site not specified: Secondary | ICD-10-CM | POA: Diagnosis not present

## 2020-08-09 DIAGNOSIS — R319 Hematuria, unspecified: Secondary | ICD-10-CM

## 2020-08-09 LAB — POCT URINALYSIS DIPSTICK
Bilirubin, UA: NEGATIVE
Glucose, UA: NEGATIVE
Ketones, UA: NEGATIVE
Nitrite, UA: NEGATIVE
Protein, UA: NEGATIVE
Urobilinogen, UA: NEGATIVE E.U./dL — AB
pH, UA: 5 (ref 5.0–8.0)

## 2020-08-09 MED ORDER — SULFAMETHOXAZOLE-TRIMETHOPRIM 800-160 MG PO TABS: 1.0000 | ORAL_TABLET | Freq: Two times a day (BID) | ORAL | 0 refills | Status: DC

## 2020-08-09 NOTE — Progress Notes (Signed)
GYNECOLOGY  VISIT  CC: G42P2002 Married White or Caucasian female here for hematuria.    HPI: 79 y.o. G35P2002 Married White or Caucasian female here for Pain in lower abdomen, frequent urination, this am had visible blood in urine (toilet was pink). Last time she voided, did not see blood Has frequent urination (new x 3 days) , and worse pain when she urinates. Went to PCP on Friday and he did a urine dip and told it everything was okay, but her symptoms got worse.  She is not sure if blood is coming from vagina or bladder Denies fever Denies back pain    GYNECOLOGIC HISTORY: Patient's last menstrual period was 09/18/1989. Contraception: hysterectomy Menopausal hormone therapy: none poct urine-rbc tr, wbc tr  Patient Active Problem List   Diagnosis Date Noted  . Essential hypertension 08/14/2016  . Osteopenia 07/30/2015    Past Medical History:  Diagnosis Date  . Cystocele   . Heart murmur    echocardiogram scheduled 04/11/13  . Hypertension   . Osteopenia   . Skin cancer     Past Surgical History:  Procedure Laterality Date  . TOTAL VAGINAL HYSTERECTOMY  10/10   with mesh cuff suspension    MEDS:   Current Outpatient Medications on File Prior to Visit  Medication Sig Dispense Refill  . Calcium Carbonate-Vitamin D (CALCIUM 600+D) 600-400 MG-UNIT per tablet Take 2 tablets by mouth daily.     Marland Kitchen DOCUSATE CALCIUM PO Take 2 tablets by mouth daily.    . metroNIDAZOLE (METROGEL) 0.75 % gel Apply 1 application topically at bedtime.    . Multiple Vitamins-Minerals (ICAPS) CAPS Take by mouth daily.    . NON FORMULARY Trunature Vision Complex w/ Lutein 25mg , Zeaxanthin 5mg ; take 1 tablet every other day    . Omega-3 Fatty Acids (FISH OIL) 1200 MG CAPS Take 1 capsule by mouth daily.     . Psyllium (METAMUCIL PO) Take by mouth daily.     . Triamcinolone Acetonide (NASACORT AQ NA) Place into the nose.    . triamterene-hydrochlorothiazide (MAXZIDE-25) 37.5-25 MG per tablet Take  0.5 tablets by mouth daily.    Marland Kitchen UNABLE TO FIND [place] allerclear     No current facility-administered medications on file prior to visit.    ALLERGIES: Patient has no known allergies.  Family History  Problem Relation Age of Onset  . Cancer Sister 39       invasive ductal cancer-oldest and younger sister  . Hypertension Mother   . Hypertension Father   . CVA Paternal Grandfather   . Heart Problems Sister        oldest of 7      Review of Systems  Constitutional: Negative.   HENT: Negative.   Eyes: Negative.   Respiratory: Negative.   Cardiovascular: Negative.   Gastrointestinal: Negative.   Endocrine: Negative.   Genitourinary:       Tingling sensation with urination, pelvic discomfort, blood in urine  Musculoskeletal: Negative.   Skin: Negative.   Allergic/Immunologic: Negative.   Neurological: Negative.   Hematological: Negative.   Psychiatric/Behavioral: Negative.     PHYSICAL EXAMINATION:    BP 118/78   Pulse 70   Resp 16   Wt 120 lb (54.4 kg)   LMP 09/18/1989   BMI 22.49 kg/m     General appearance: alert, cooperative and appears stated age No acute distress Neg CVAT  Pelvic: External genitalia:  no lesions  Urethra:  gaping urethra, pale                           Vagina: normal appearing vagina for age with normal discharge, no evidence of blood                Chaperone,Taylor, CMA, was present for exam.  Assessment: Hematuria, suspect cystitis  Plan: Bactrim DS BID x 3 Urine sent for culture Pt to call back if frank blood, fever or symptoms get worse

## 2020-08-09 NOTE — Telephone Encounter (Signed)
Patient noticed blood in urine.

## 2020-08-09 NOTE — Patient Instructions (Signed)

## 2020-08-09 NOTE — Telephone Encounter (Signed)
Patient states she saw PCP last week due to urinary frequency. Urine was neg. Told to take tylenol. Patient is now seeing blood in urine and concerned. She denies any fever but is having some lower pelvic discomfort. Requesting appt. Made appointment for today at 3:30pm, to arrive at 3:15 to register with Karma Ganja, NP.

## 2020-08-10 ENCOUNTER — Telehealth: Payer: Self-pay

## 2020-08-10 NOTE — Telephone Encounter (Signed)
Patient says prescription for Bactrim is not at pharmacy. [facility] on [address].

## 2020-08-10 NOTE — Telephone Encounter (Signed)
Spoke with spouse, Sonia Side, he states pharmacy never received Rx for Bactrim DS. Advised I would call [facility] and give verbal if it hadn't gone through. (pt. Unavailable--at church_  Spoke with [facility] and pharmacist did not have e-Rx so I gave verbal Rx per chart. He did state patient had never filled Rx with their pharmacy and would need her Ins. Card.  Called spouse back and gave him above information. He thanked me for my help

## 2020-08-13 LAB — URINE CULTURE

## 2020-09-24 ENCOUNTER — Ambulatory Visit: Payer: Medicare Other

## 2020-10-18 DIAGNOSIS — H40023 Open angle with borderline findings, high risk, bilateral: Secondary | ICD-10-CM | POA: Diagnosis not present

## 2020-10-22 ENCOUNTER — Ambulatory Visit
Admission: RE | Admit: 2020-10-22 | Discharge: 2020-10-22 | Disposition: A | Discharge status: Home / Self Care | Payer: Medicare Other | Source: Ambulatory Visit | Attending: Internal Medicine | Admitting: Internal Medicine

## 2020-10-22 ENCOUNTER — Other Ambulatory Visit: Payer: Self-pay

## 2020-10-22 DIAGNOSIS — Z0000 Encounter for general adult medical examination without abnormal findings: Secondary | ICD-10-CM

## 2020-10-22 DIAGNOSIS — Z1231 Encounter for screening mammogram for malignant neoplasm of breast: Secondary | ICD-10-CM | POA: Diagnosis not present

## 2020-11-23 DIAGNOSIS — H25043 Posterior subcapsular polar age-related cataract, bilateral: Secondary | ICD-10-CM | POA: Diagnosis not present

## 2020-11-23 DIAGNOSIS — H2513 Age-related nuclear cataract, bilateral: Secondary | ICD-10-CM | POA: Diagnosis not present

## 2020-11-23 DIAGNOSIS — H2511 Age-related nuclear cataract, right eye: Secondary | ICD-10-CM | POA: Diagnosis not present

## 2020-11-23 DIAGNOSIS — H25013 Cortical age-related cataract, bilateral: Secondary | ICD-10-CM | POA: Diagnosis not present

## 2020-11-23 DIAGNOSIS — H18413 Arcus senilis, bilateral: Secondary | ICD-10-CM | POA: Diagnosis not present

## 2020-11-23 DIAGNOSIS — H353131 Nonexudative age-related macular degeneration, bilateral, early dry stage: Secondary | ICD-10-CM | POA: Diagnosis not present

## 2020-12-09 DIAGNOSIS — R0789 Other chest pain: Secondary | ICD-10-CM | POA: Diagnosis not present

## 2020-12-09 DIAGNOSIS — R2681 Unsteadiness on feet: Secondary | ICD-10-CM | POA: Diagnosis not present

## 2020-12-09 DIAGNOSIS — I10 Essential (primary) hypertension: Secondary | ICD-10-CM | POA: Diagnosis not present

## 2020-12-30 ENCOUNTER — Ambulatory Visit: Payer: Medicare Other | Admitting: Obstetrics and Gynecology

## 2021-01-03 DIAGNOSIS — I10 Essential (primary) hypertension: Secondary | ICD-10-CM | POA: Diagnosis not present

## 2021-01-13 ENCOUNTER — Encounter (HOSPITAL_BASED_OUTPATIENT_CLINIC_OR_DEPARTMENT_OTHER): Payer: Self-pay | Admitting: Obstetrics & Gynecology

## 2021-01-13 ENCOUNTER — Ambulatory Visit (INDEPENDENT_AMBULATORY_CARE_PROVIDER_SITE_OTHER): Payer: Medicare Other | Admitting: Obstetrics & Gynecology

## 2021-01-13 ENCOUNTER — Other Ambulatory Visit: Payer: Self-pay

## 2021-01-13 VITALS — BP 144/86 | HR 74 | Ht 61.25 in | Wt 120.8 lb

## 2021-01-13 DIAGNOSIS — M8589 Other specified disorders of bone density and structure, multiple sites: Secondary | ICD-10-CM | POA: Diagnosis not present

## 2021-01-13 DIAGNOSIS — M8580 Other specified disorders of bone density and structure, unspecified site: Secondary | ICD-10-CM

## 2021-01-13 DIAGNOSIS — Z90710 Acquired absence of both cervix and uterus: Secondary | ICD-10-CM | POA: Diagnosis not present

## 2021-01-13 DIAGNOSIS — Z01419 Encounter for gynecological examination (general) (routine) without abnormal findings: Secondary | ICD-10-CM

## 2021-01-13 DIAGNOSIS — N3281 Overactive bladder: Secondary | ICD-10-CM

## 2021-01-13 DIAGNOSIS — Z87448 Personal history of other diseases of urinary system: Secondary | ICD-10-CM

## 2021-01-13 DIAGNOSIS — Z803 Family history of malignant neoplasm of breast: Secondary | ICD-10-CM | POA: Diagnosis not present

## 2021-01-13 NOTE — Progress Notes (Signed)
80 y.o. G5P2002 Married White or Caucasian female here for breast and pelvic exam.  I am following her bone density tests. She has osteopenia in both hips.  Needs BMD next year.  Order will be placed.    Denies vaginal bleeding.  Bladder function was normal.  Patient's last menstrual period was 09/18/1989.          Sexually active: Yes.    H/O STD:  no  Health Maintenance: PCP:  Dr. Beather Arbour.  Last wellness appt was 07/2021.  Did blood work at that appt:  yes Vaccines are up to date:  Reviewed.  Aware I do not have pneumonia vaccination Colonoscopy:  2017, follow up 10 years MMG:  10/2020 BMD:  09/2019 Last pap smear:  2010.   H/o abnormal pap smear: no     reports that she has never smoked. She has never used smokeless tobacco. She reports that she does not drink alcohol and does not use drugs.  Past Medical History:  Diagnosis Date  . Cystocele   . Heart murmur    echocardiogram scheduled 04/11/13  . Hypertension   . Osteopenia   . Skin cancer     Past Surgical History:  Procedure Laterality Date  . TOTAL VAGINAL HYSTERECTOMY  10/10   with mesh cuff suspension    Current Outpatient Medications  Medication Sig Dispense Refill  . amLODipine (NORVASC) 2.5 MG tablet     . Calcium Carbonate-Vitamin D 600-400 MG-UNIT tablet Take 2 tablets by mouth daily.     Marland Kitchen DOCUSATE CALCIUM PO Take 2 tablets by mouth daily.    . Multiple Vitamins-Minerals (ICAPS) CAPS Take by mouth daily.    . NON FORMULARY Trunature Vision Complex w/ Lutein 25mg , Zeaxanthin 5mg ; take 1 tablet every other day    . Omega-3 Fatty Acids (FISH OIL) 1200 MG CAPS Take 1 capsule by mouth daily.     . Psyllium (METAMUCIL PO) Take by mouth daily.     . Triamcinolone Acetonide (NASACORT AQ NA) Place into the nose.    . triamterene-hydrochlorothiazide (MAXZIDE-25) 37.5-25 MG per tablet Take 0.5 tablets by mouth daily.    Marland Kitchen UNABLE TO FIND [place] allerclear     No current facility-administered medications for this  visit.    Family History  Problem Relation Age of Onset  . Cancer Sister 88       invasive ductal cancer-oldest and younger sister  . Hypertension Mother   . Hypertension Father   . CVA Paternal Grandfather   . Heart Problems Sister        oldest of 7    Review of Systems  Constitutional: Negative.   Gastrointestinal: Negative.   Genitourinary: Negative.     Exam:   BP (!) 144/86   Pulse 74   Ht 5' 1.25" (1.556 m)   Wt 120 lb 12.8 oz (54.8 kg)   LMP 09/18/1989   BMI 22.64 kg/m   Height: 5' 1.25" (155.6 cm)  General appearance: alert, cooperative and appears stated age Breasts: normal appearance, no masses or tenderness Abdomen: soft, non-tender; bowel sounds normal; no masses,  no organomegaly Lymph nodes: Cervical, supraclavicular, and axillary nodes normal.  No abnormal inguinal nodes palpated Neurologic: Grossly normal  Pelvic: External genitalia:  no lesions              Urethra:  normal appearing urethra with no masses, tenderness or lesions              Bartholins and Skenes: normal  Vagina: normal appearing vagina with atrophic changes and no discharge, no lesions              Cervix: absent              Pap taken: No. Bimanual Exam:  Uterus:  uterus absent              Adnexa: no mass, fullness, tenderness               Rectovaginal: Confirms               Anus:  normal sphincter tone, no lesions  Chaperone, Octaviano Batty, CMA, was present for exam.  Assessment/Plan: 1. Gynecologic exam normal - pap not indicated - MMG 10/2020 - BMD 09/2019 - lab work with Dr. Laurann Montana - vaccines updated - did blood in November  2. Osteopenia, unspecified location - DG BONE DENSITY (DXA); Future  3. Other specified disorders of bone density and structure, multiple sites   4. Family history of breast cancer  5. History of total vaginal hysterectomy (TVH)  7. OAB (overactive bladder) - declines medication  Total time with pt:  24 mintues

## 2021-02-11 ENCOUNTER — Ambulatory Visit: Payer: Medicare Other | Admitting: Obstetrics & Gynecology

## 2021-02-21 DIAGNOSIS — H2511 Age-related nuclear cataract, right eye: Secondary | ICD-10-CM | POA: Diagnosis not present

## 2021-02-22 DIAGNOSIS — H2512 Age-related nuclear cataract, left eye: Secondary | ICD-10-CM | POA: Diagnosis not present

## 2021-03-14 DIAGNOSIS — H52202 Unspecified astigmatism, left eye: Secondary | ICD-10-CM | POA: Diagnosis not present

## 2021-03-14 DIAGNOSIS — H2512 Age-related nuclear cataract, left eye: Secondary | ICD-10-CM | POA: Diagnosis not present

## 2021-06-09 DIAGNOSIS — C44311 Basal cell carcinoma of skin of nose: Secondary | ICD-10-CM | POA: Diagnosis not present

## 2021-07-06 DIAGNOSIS — C44311 Basal cell carcinoma of skin of nose: Secondary | ICD-10-CM | POA: Diagnosis not present

## 2021-07-06 DIAGNOSIS — Z85828 Personal history of other malignant neoplasm of skin: Secondary | ICD-10-CM | POA: Diagnosis not present

## 2021-07-11 ENCOUNTER — Other Ambulatory Visit: Payer: Self-pay | Admitting: Obstetrics & Gynecology

## 2021-07-11 DIAGNOSIS — Z1231 Encounter for screening mammogram for malignant neoplasm of breast: Secondary | ICD-10-CM

## 2021-08-16 DIAGNOSIS — Z1389 Encounter for screening for other disorder: Secondary | ICD-10-CM | POA: Diagnosis not present

## 2021-08-16 DIAGNOSIS — I10 Essential (primary) hypertension: Secondary | ICD-10-CM | POA: Diagnosis not present

## 2021-08-16 DIAGNOSIS — Z0000 Encounter for general adult medical examination without abnormal findings: Secondary | ICD-10-CM | POA: Diagnosis not present

## 2021-08-18 DIAGNOSIS — Z85828 Personal history of other malignant neoplasm of skin: Secondary | ICD-10-CM | POA: Diagnosis not present

## 2021-08-18 DIAGNOSIS — L570 Actinic keratosis: Secondary | ICD-10-CM | POA: Diagnosis not present

## 2021-08-18 DIAGNOSIS — L720 Epidermal cyst: Secondary | ICD-10-CM | POA: Diagnosis not present

## 2021-08-18 DIAGNOSIS — D1801 Hemangioma of skin and subcutaneous tissue: Secondary | ICD-10-CM | POA: Diagnosis not present

## 2021-08-18 DIAGNOSIS — D2262 Melanocytic nevi of left upper limb, including shoulder: Secondary | ICD-10-CM | POA: Diagnosis not present

## 2021-08-18 DIAGNOSIS — D225 Melanocytic nevi of trunk: Secondary | ICD-10-CM | POA: Diagnosis not present

## 2021-08-18 DIAGNOSIS — B351 Tinea unguium: Secondary | ICD-10-CM | POA: Diagnosis not present

## 2021-08-18 DIAGNOSIS — D2239 Melanocytic nevi of other parts of face: Secondary | ICD-10-CM | POA: Diagnosis not present

## 2021-08-18 DIAGNOSIS — L821 Other seborrheic keratosis: Secondary | ICD-10-CM | POA: Diagnosis not present

## 2021-10-25 DIAGNOSIS — H35363 Drusen (degenerative) of macula, bilateral: Secondary | ICD-10-CM | POA: Diagnosis not present

## 2021-11-29 DIAGNOSIS — R059 Cough, unspecified: Secondary | ICD-10-CM | POA: Diagnosis not present

## 2021-11-29 DIAGNOSIS — Z03818 Encounter for observation for suspected exposure to other biological agents ruled out: Secondary | ICD-10-CM | POA: Diagnosis not present

## 2021-11-30 ENCOUNTER — Ambulatory Visit
Admission: RE | Admit: 2021-11-30 | Discharge: 2021-11-30 | Disposition: A | Discharge status: Home / Self Care | Payer: Medicare Other | Source: Ambulatory Visit | Attending: Internal Medicine | Admitting: Internal Medicine

## 2021-11-30 ENCOUNTER — Other Ambulatory Visit: Payer: Self-pay | Admitting: Internal Medicine

## 2021-11-30 ENCOUNTER — Other Ambulatory Visit: Payer: Self-pay

## 2021-11-30 DIAGNOSIS — R058 Other specified cough: Secondary | ICD-10-CM

## 2021-11-30 DIAGNOSIS — R059 Cough, unspecified: Secondary | ICD-10-CM | POA: Diagnosis not present

## 2021-11-30 DIAGNOSIS — J189 Pneumonia, unspecified organism: Secondary | ICD-10-CM | POA: Diagnosis not present

## 2021-11-30 DIAGNOSIS — R3911 Hesitancy of micturition: Secondary | ICD-10-CM | POA: Diagnosis not present

## 2021-12-08 DIAGNOSIS — L304 Erythema intertrigo: Secondary | ICD-10-CM | POA: Diagnosis not present

## 2021-12-08 DIAGNOSIS — J189 Pneumonia, unspecified organism: Secondary | ICD-10-CM | POA: Diagnosis not present

## 2021-12-09 ENCOUNTER — Ambulatory Visit
Admission: RE | Admit: 2021-12-09 | Discharge: 2021-12-09 | Disposition: A | Discharge status: Home / Self Care | Payer: Medicare Other | Source: Ambulatory Visit | Attending: Obstetrics & Gynecology | Admitting: Obstetrics & Gynecology

## 2021-12-09 DIAGNOSIS — M8589 Other specified disorders of bone density and structure, multiple sites: Secondary | ICD-10-CM

## 2021-12-09 DIAGNOSIS — Z780 Asymptomatic menopausal state: Secondary | ICD-10-CM | POA: Diagnosis not present

## 2021-12-09 DIAGNOSIS — M8580 Other specified disorders of bone density and structure, unspecified site: Secondary | ICD-10-CM

## 2021-12-09 DIAGNOSIS — Z1231 Encounter for screening mammogram for malignant neoplasm of breast: Secondary | ICD-10-CM | POA: Diagnosis not present

## 2022-01-27 ENCOUNTER — Ambulatory Visit (INDEPENDENT_AMBULATORY_CARE_PROVIDER_SITE_OTHER): Payer: Medicare Other | Admitting: Obstetrics & Gynecology

## 2022-01-27 ENCOUNTER — Encounter (HOSPITAL_BASED_OUTPATIENT_CLINIC_OR_DEPARTMENT_OTHER): Payer: Self-pay | Admitting: Obstetrics & Gynecology

## 2022-01-27 VITALS — BP 166/79 | HR 56 | Ht 61.5 in | Wt 121.4 lb

## 2022-01-27 DIAGNOSIS — Z803 Family history of malignant neoplasm of breast: Secondary | ICD-10-CM

## 2022-01-27 DIAGNOSIS — L609 Nail disorder, unspecified: Secondary | ICD-10-CM

## 2022-01-27 DIAGNOSIS — M8580 Other specified disorders of bone density and structure, unspecified site: Secondary | ICD-10-CM

## 2022-01-27 DIAGNOSIS — F4321 Adjustment disorder with depressed mood: Secondary | ICD-10-CM

## 2022-01-27 DIAGNOSIS — Z01419 Encounter for gynecological examination (general) (routine) without abnormal findings: Secondary | ICD-10-CM

## 2022-01-27 DIAGNOSIS — N3281 Overactive bladder: Secondary | ICD-10-CM

## 2022-01-27 DIAGNOSIS — Z87448 Personal history of other diseases of urinary system: Secondary | ICD-10-CM

## 2022-01-27 DIAGNOSIS — N8110 Cystocele, unspecified: Secondary | ICD-10-CM | POA: Insufficient documentation

## 2022-01-27 DIAGNOSIS — Z90710 Acquired absence of both cervix and uterus: Secondary | ICD-10-CM

## 2022-01-27 NOTE — Progress Notes (Signed)
81 y.o. G60P2002 Married White or Caucasian female here for breast and pelvic exam.  Pt lost her son earlier this year.  He had issues with alcohol.  Was found slumped in a chair where he lived.  Pt with grief and some guilt wondering if she could have done anything more.  Has a sister who is very supportive.  Therapy discussed.  Pt does not want information at this time but asked her to call if she changes her mind. ? ?BMD findings discussed.  Osteopenia present but pt high risk by FRAX scoring for hip and other bone fracture.  BMD is stable.  She does not want to be on medication.  As BMD stable, feel it is reasonable to monitore. ? ?Denies vaginal bleeding.  Bladder function stable.  Still has some urgency but does not leak much.  H/o TVH with cystocele repair. ? ?Nail, toanail concerns.  Has darkened nail that is coming away from nail bed.  Has seen dermatology.  Was advised to keep Band-Aid on it.  Suggested she be seen again and ask for nail cutting to ensure this is nail fungus. ? ?Patient's last menstrual period was 09/18/1989.          ?Sexually active: Yes.    ?H/O STD:  no ? ?Health Maintenance: ?PCP:  Dr. Lavone Orn.  Last wellness appt was 07/2022.  Did blood work at that appt:  yes ?Vaccines are up to date:  yes ?Colonoscopy:  05/25/2016  ?MMG:  12/09/2021 Negative ?BMD:  12/09/2021 Osteopenia ?Last pap smear:  2010  ? ? ? reports that she has never smoked. She has never used smokeless tobacco. She reports that she does not drink alcohol and does not use drugs. ? ?Past Medical History:  ?Diagnosis Date  ? Cystocele   ? Heart murmur   ? echocardiogram scheduled 04/11/13  ? Hypertension   ? Osteopenia   ? Skin cancer   ? ? ?Past Surgical History:  ?Procedure Laterality Date  ? TOTAL VAGINAL HYSTERECTOMY  10/10  ? with mesh cuff suspension  ? ? ?Current Outpatient Medications  ?Medication Sig Dispense Refill  ? amLODipine (NORVASC) 2.5 MG tablet     ? Calcium Carbonate-Vitamin D 600-400 MG-UNIT tablet  Take 2 tablets by mouth daily.     ? Multiple Vitamins-Minerals (ICAPS) CAPS Take by mouth daily.    ? NON FORMULARY Trunature Vision Complex w/ Lutein '25mg'$ , Zeaxanthin '5mg'$ ; take 1 tablet every other day    ? Omega-3 Fatty Acids (FISH OIL) 1200 MG CAPS Take 1 capsule by mouth daily.     ? Psyllium (METAMUCIL PO) Take by mouth daily.     ? Triamcinolone Acetonide (NASACORT AQ NA) Place into the nose.    ? triamterene-hydrochlorothiazide (MAXZIDE-25) 37.5-25 MG per tablet Take 0.5 tablets by mouth daily.    ? UNABLE TO FIND [place] allerclear    ? ?No current facility-administered medications for this visit.  ? ? ?Family History  ?Problem Relation Age of Onset  ? Cancer Sister 73  ?     invasive ductal cancer-oldest and younger sister  ? Hypertension Mother   ? Hypertension Father   ? CVA Paternal Grandfather   ? Heart Problems Sister   ?     oldest of 3  ? ? ?Review of Systems  ?Constitutional: Negative.   ?Genitourinary:  Positive for urgency.  ? ?Exam:   ?BP (!) 166/79 (BP Location: Right Arm, Patient Position: Sitting, Cuff Size: Normal)   Pulse (!) 56  Ht 5' 1.5" (1.562 m)   Wt 121 lb 6.4 oz (55.1 kg)   LMP 09/18/1989   BMI 22.57 kg/m?   Height: 5' 1.5" (156.2 cm) ? ?General appearance: alert, cooperative and appears stated age ?Breasts: normal appearance, no masses or tenderness ?Abdomen: soft, non-tender; bowel sounds normal; no masses,  no organomegaly ?Lymph nodes: Cervical, supraclavicular, and axillary nodes normal.  No abnormal inguinal nodes palpated ?Neurologic: Grossly normal ? ?Pelvic: External genitalia:  no lesions ?             Urethra:  normal appearing urethra with no masses, tenderness or lesions ?             Bartholins and Skenes: normal    ?             Vagina: normal appearing vagina with atrophic changes and no discharge, no lesions ?             Cervix: absent ?             Pap taken: No. ?Bimanual Exam:  Uterus:  uterus absent ?             Adnexa: no mass, fullness, tenderness ?               Rectovaginal: Confirms ?              Anus:  normal sphincter tone, no lesions ? ?Chaperone, Octaviano Batty, CMA, was present for exam. ? ?Assessment/Plan: ?1. Encntr for gyn exam (general) (routine) w/o abn findings ?- Pap 2010 prior to hysterectomy ?- MMG 12/09/2021 ?- Colonoscopy 05/2016 ?- BMD 11/2019 ?- lab work done with Dr. Lavone Orn ?- vaccines reviewed/updated ? ?2. History of cystocele ? ?3. Family history of breast cancer ? ?4. Osteopenia, unspecified location ?- declines treatment ?- '1200mg'$  calcium and 1000 IU Vit D daily recommended ? ?5. Grief reaction ?- therapy/grief counseling suggested.  Pt does not desire at this time ? ?6. History of total vaginal hysterectomy (TVH) ? ?7. OAB (overactive bladder) ? ?8. Nail abnormality ?- recommended f/u with dermatologist again  ? ? ?

## 2022-01-27 NOTE — Patient Instructions (Addendum)
[facility] ?[address] ?[place], [place] [address] ?Phone: [phone] Fax: [phone] ? ?Monday - Thursday ?8:00 a.m. - 12:00 p.m. ?1:00 p.m. - 5:00 p.m. ? ?Friday ? ?7:30 a.m. - 2:30 p.m. ?

## 2022-01-30 DIAGNOSIS — Z961 Presence of intraocular lens: Secondary | ICD-10-CM | POA: Diagnosis not present

## 2022-01-30 DIAGNOSIS — H18413 Arcus senilis, bilateral: Secondary | ICD-10-CM | POA: Diagnosis not present

## 2022-01-30 DIAGNOSIS — H35363 Drusen (degenerative) of macula, bilateral: Secondary | ICD-10-CM | POA: Diagnosis not present

## 2022-02-16 ENCOUNTER — Ambulatory Visit (INDEPENDENT_AMBULATORY_CARE_PROVIDER_SITE_OTHER): Payer: Medicare Other | Admitting: Podiatry

## 2022-02-16 DIAGNOSIS — M21611 Bunion of right foot: Secondary | ICD-10-CM | POA: Diagnosis not present

## 2022-02-16 DIAGNOSIS — M2012 Hallux valgus (acquired), left foot: Secondary | ICD-10-CM

## 2022-02-16 DIAGNOSIS — M2041 Other hammer toe(s) (acquired), right foot: Secondary | ICD-10-CM

## 2022-02-16 DIAGNOSIS — M79674 Pain in right toe(s): Secondary | ICD-10-CM | POA: Diagnosis not present

## 2022-02-16 DIAGNOSIS — M2011 Hallux valgus (acquired), right foot: Secondary | ICD-10-CM | POA: Diagnosis not present

## 2022-02-16 DIAGNOSIS — B351 Tinea unguium: Secondary | ICD-10-CM

## 2022-02-16 DIAGNOSIS — M21612 Bunion of left foot: Secondary | ICD-10-CM | POA: Diagnosis not present

## 2022-02-16 DIAGNOSIS — M79675 Pain in left toe(s): Secondary | ICD-10-CM | POA: Diagnosis not present

## 2022-02-16 DIAGNOSIS — M2042 Other hammer toe(s) (acquired), left foot: Secondary | ICD-10-CM

## 2022-02-16 MED ORDER — TERBINAFINE HCL 250 MG PO TABS: 250.0000 mg | ORAL_TABLET | Freq: Every day | ORAL | 0 refills | Status: DC

## 2022-02-16 NOTE — Progress Notes (Signed)
  Subjective:  Patient ID: Rose Olson, female    DOB: 08/03/1941,  MRN: [id number]  Chief Complaint  Patient presents with   Nail Problem    The patient states that the left foot great toe hurts and that the nail has fell of previously.    81 y.o. female presents with the above complaint. History confirmed with patient.  This been going on for some time and worsening.  She also has a painful corn between the toes on the right foot where she has a bunion and hammertoes.  She uses a cotton pad to alleviate pressure here.  Objective:  Physical Exam: warm, good capillary refill, no trophic changes or ulcerative lesions, normal DP and PT pulses, and normal sensory exam. Left Foot: dystrophic yellowed discolored nail plates with subungual debris and severe dystrophy of the hallux nail, she has hallux valgus deformity Right Foot: dystrophic yellowed discolored nail plates with subungual debris and hallux valgus deformity with lateral corn on the hallux abutting the second toe    Assessment:   1. Pain due to onychomycosis of toenails of both feet   2. Hallux valgus with bunions, left   3. Hallux valgus with bunions, right   4. Hammertoe of right foot   5. Hammertoe of left foot      Plan:  Patient was evaluated and treated and all questions answered.  We discussed the etiology and treatment options of bunion and hammertoe deformities in detail.  Discussed surgical and nonsurgical treatment.  We discussed using silicone pads or cotton pads which she has been doing to alleviate pressure here.  Currently this is only minimally bothersome.  We also discussed surgical correction of this if it does not improve.  I think likely she would need a first MPJ fusion and hammertoe correction on the right foot.  She will consider this and return to see me for surgical planning if she is interested in this   We also discussed the etiology and treatment options of onychomycosis in detail.  This has  been worsening over time.  Has not improved.  She has used OTC fungal's without improvement.  We discussed the pros and cons of laser topical and oral therapy.  I do not think laser or topical therapy will improve this much of the amount of dystrophy that she has.  I debrided the nails in length and thickness with a sharp nail nipper and they were smoothed and thinned with a rotary bur.  Prescribed her Lamisil 90-day course.  We discussed the risk and possible side effects of this.  Plan to check LFTs after next visit her last routine labs were normal  No follow-ups on file.

## 2022-02-20 ENCOUNTER — Telehealth: Payer: Self-pay | Admitting: Podiatry

## 2022-02-20 DIAGNOSIS — B351 Tinea unguium: Secondary | ICD-10-CM

## 2022-02-20 NOTE — Telephone Encounter (Signed)
Patient stated that she was seen by Dr. Sherryle Lis last Thursday. She was calling because she had some questions about the medication that was prescribed. Please call patient

## 2022-02-20 NOTE — Telephone Encounter (Signed)
Spoke with patient by phone ordered LFTs and she will get this done at [facility], address sent to her via MyChart message.  We will start terbinafine as long as LFTs are normal after

## 2022-02-21 DIAGNOSIS — B351 Tinea unguium: Secondary | ICD-10-CM | POA: Diagnosis not present

## 2022-02-22 LAB — HEPATIC FUNCTION PANEL
ALT: 16 IU/L (ref 0–32)
AST: 20 IU/L (ref 0–40)
Albumin: 4.3 g/dL (ref 3.7–4.7)
Alkaline Phosphatase: 66 IU/L (ref 44–121)
Bilirubin Total: 0.3 mg/dL (ref 0.0–1.2)
Bilirubin, Direct: <0.1 mg/dL (ref 0.00–0.40)
Total Protein: 6.8 g/dL (ref 6.0–8.5)

## 2022-04-19 DIAGNOSIS — H65193 Other acute nonsuppurative otitis media, bilateral: Secondary | ICD-10-CM | POA: Diagnosis not present

## 2022-06-20 ENCOUNTER — Ambulatory Visit (INDEPENDENT_AMBULATORY_CARE_PROVIDER_SITE_OTHER): Payer: Medicare Other | Admitting: Podiatry

## 2022-06-20 DIAGNOSIS — M79675 Pain in left toe(s): Secondary | ICD-10-CM | POA: Diagnosis not present

## 2022-06-20 DIAGNOSIS — B351 Tinea unguium: Secondary | ICD-10-CM | POA: Diagnosis not present

## 2022-06-20 DIAGNOSIS — M79674 Pain in right toe(s): Secondary | ICD-10-CM

## 2022-06-22 NOTE — Progress Notes (Signed)
  Subjective:  Patient ID: Rose Olson, female    DOB: 1941/07/05,  MRN: [id number]  Chief Complaint  Patient presents with   Nail Problem    Thick painful toenails, 4   month follow up     81 y.o. female returns for follow-up with the above complaint. History confirmed with patient.  She completed the Lamisil she did not notice much change  Objective:  Physical Exam: warm, good capillary refill, no trophic changes or ulcerative lesions, normal DP and PT pulses, and normal sensory exam.  Some improvement in the nail appearance but still has thickened elongated yellow discolored nails with subungual debris     Assessment:   1. Pain due to onychomycosis of toenails of both feet      Plan:  Patient was evaluated and treated and all questions answered.  Had some improvement but not a significant change.  We discussed further Lamisil treatment.  I do not think this is likely to provide much improvement at this point.  I recommended routine regular debridement.  She will return in 4 months for this.  Today all nails were debrided in length and thickness using a sharp nail nipper to a tolerable level.  She will follow-up with me in 4 months.  Return in about 4 months (around 10/21/2022) for RFC.

## 2022-07-19 DIAGNOSIS — H903 Sensorineural hearing loss, bilateral: Secondary | ICD-10-CM | POA: Insufficient documentation

## 2022-07-19 DIAGNOSIS — H9313 Tinnitus, bilateral: Secondary | ICD-10-CM | POA: Diagnosis not present

## 2022-08-24 DIAGNOSIS — Z5181 Encounter for therapeutic drug level monitoring: Secondary | ICD-10-CM | POA: Diagnosis not present

## 2022-08-24 DIAGNOSIS — Z23 Encounter for immunization: Secondary | ICD-10-CM | POA: Diagnosis not present

## 2022-08-24 DIAGNOSIS — Z0000 Encounter for general adult medical examination without abnormal findings: Secondary | ICD-10-CM | POA: Diagnosis not present

## 2022-08-24 DIAGNOSIS — I10 Essential (primary) hypertension: Secondary | ICD-10-CM | POA: Diagnosis not present

## 2022-08-24 DIAGNOSIS — M8580 Other specified disorders of bone density and structure, unspecified site: Secondary | ICD-10-CM | POA: Diagnosis not present

## 2022-08-24 DIAGNOSIS — Z1331 Encounter for screening for depression: Secondary | ICD-10-CM | POA: Diagnosis not present

## 2022-08-29 DIAGNOSIS — D2239 Melanocytic nevi of other parts of face: Secondary | ICD-10-CM | POA: Diagnosis not present

## 2022-08-29 DIAGNOSIS — D1801 Hemangioma of skin and subcutaneous tissue: Secondary | ICD-10-CM | POA: Diagnosis not present

## 2022-08-29 DIAGNOSIS — B351 Tinea unguium: Secondary | ICD-10-CM | POA: Diagnosis not present

## 2022-08-29 DIAGNOSIS — L718 Other rosacea: Secondary | ICD-10-CM | POA: Diagnosis not present

## 2022-08-29 DIAGNOSIS — L814 Other melanin hyperpigmentation: Secondary | ICD-10-CM | POA: Diagnosis not present

## 2022-08-29 DIAGNOSIS — D225 Melanocytic nevi of trunk: Secondary | ICD-10-CM | POA: Diagnosis not present

## 2022-08-29 DIAGNOSIS — L821 Other seborrheic keratosis: Secondary | ICD-10-CM | POA: Diagnosis not present

## 2022-08-29 DIAGNOSIS — D2262 Melanocytic nevi of left upper limb, including shoulder: Secondary | ICD-10-CM | POA: Diagnosis not present

## 2022-08-29 DIAGNOSIS — Z85828 Personal history of other malignant neoplasm of skin: Secondary | ICD-10-CM | POA: Diagnosis not present

## 2022-08-29 DIAGNOSIS — L720 Epidermal cyst: Secondary | ICD-10-CM | POA: Diagnosis not present

## 2022-10-24 ENCOUNTER — Ambulatory Visit (INDEPENDENT_AMBULATORY_CARE_PROVIDER_SITE_OTHER): Payer: Medicare Other | Admitting: Podiatry

## 2022-10-24 DIAGNOSIS — B351 Tinea unguium: Secondary | ICD-10-CM

## 2022-10-24 DIAGNOSIS — M79675 Pain in left toe(s): Secondary | ICD-10-CM

## 2022-10-24 DIAGNOSIS — M79674 Pain in right toe(s): Secondary | ICD-10-CM

## 2022-10-26 ENCOUNTER — Other Ambulatory Visit: Payer: Self-pay | Admitting: Obstetrics & Gynecology

## 2022-10-26 DIAGNOSIS — Z1231 Encounter for screening mammogram for malignant neoplasm of breast: Secondary | ICD-10-CM

## 2022-10-29 ENCOUNTER — Encounter: Payer: Self-pay | Admitting: Podiatry

## 2022-10-29 NOTE — Progress Notes (Signed)
  Subjective:  Patient ID: Rose Olson, female    DOB: 07-Apr-1941,  MRN: [id number]  Chief Complaint  Patient presents with   Nail Problem    Thick painful toenails, 4 month follow up    82 y.o. female returns for follow-up with the above complaint. History confirmed with patient.  She is doing well the nails are becoming thick and elongated causing pain again.  Objective:  Physical Exam: warm, good capillary refill, no trophic changes or ulcerative lesions, normal DP and PT pulses, and normal sensory exam.  Some improvement in the nail appearance but still has thickened elongated yellow discolored nails with subungual debris     Assessment:   1. Pain due to onychomycosis of toenails of both feet      Plan:  Patient was evaluated and treated and all questions answered.  Discussed the etiology and treatment options for the condition in detail with the patient. Educated patient on the topical and oral treatment options for mycotic nails. Recommended debridement of the nails today. Sharp and mechanical debridement performed of all painful and mycotic nails today. Nails debrided in length and thickness using a nail nipper to level of comfort. Discussed treatment options including appropriate shoe gear. Follow up as needed for painful nails.    Return in about 4 months (around 02/22/2023) for painful thick fungal nails.

## 2022-11-07 DIAGNOSIS — H35363 Drusen (degenerative) of macula, bilateral: Secondary | ICD-10-CM | POA: Diagnosis not present

## 2022-11-07 DIAGNOSIS — H35033 Hypertensive retinopathy, bilateral: Secondary | ICD-10-CM | POA: Diagnosis not present

## 2022-12-11 ENCOUNTER — Ambulatory Visit
Admission: RE | Admit: 2022-12-11 | Discharge: 2022-12-11 | Disposition: A | Discharge status: Home / Self Care | Payer: Medicare Other | Source: Ambulatory Visit | Attending: Obstetrics & Gynecology | Admitting: Obstetrics & Gynecology

## 2022-12-11 DIAGNOSIS — Z1231 Encounter for screening mammogram for malignant neoplasm of breast: Secondary | ICD-10-CM

## 2023-02-09 ENCOUNTER — Ambulatory Visit (INDEPENDENT_AMBULATORY_CARE_PROVIDER_SITE_OTHER): Payer: Medicare Other | Admitting: Obstetrics & Gynecology

## 2023-02-09 ENCOUNTER — Encounter (HOSPITAL_BASED_OUTPATIENT_CLINIC_OR_DEPARTMENT_OTHER): Payer: Self-pay | Admitting: Obstetrics & Gynecology

## 2023-02-09 VITALS — BP 128/86 | HR 57 | Ht 61.5 in | Wt 123.0 lb

## 2023-02-09 DIAGNOSIS — N8110 Cystocele, unspecified: Secondary | ICD-10-CM

## 2023-02-09 DIAGNOSIS — Z803 Family history of malignant neoplasm of breast: Secondary | ICD-10-CM | POA: Diagnosis not present

## 2023-02-09 DIAGNOSIS — Z9189 Other specified personal risk factors, not elsewhere classified: Secondary | ICD-10-CM | POA: Diagnosis not present

## 2023-02-09 DIAGNOSIS — I10 Essential (primary) hypertension: Secondary | ICD-10-CM

## 2023-02-09 DIAGNOSIS — M8580 Other specified disorders of bone density and structure, unspecified site: Secondary | ICD-10-CM | POA: Diagnosis not present

## 2023-02-09 NOTE — Progress Notes (Unsigned)
82 y.o. G68P2002 Married White or Caucasian female here for breast and pelvic exam.  I am also following her for h/o prolapse and osteopenia.  Taking Calcium and Vit D.  Last BMD was 2023.  She had an elevated calcium level with lab work in December.  Has not made any changes with her vitamins.  Will recheck today.  Lost son in 2023.  She reports she is doing better.  Granddaughter graduated from high school last weekend.  She is going to a smaller private school in [place].  Denies vaginal bleeding.  Patient's last menstrual period was 09/18/1989.          Sexually active: Yes.    H/O STD:  no  Health Maintenance: PCP:  Dr. Kirby Funk.  Last wellness appt was 07/2022.  Did blood work at that appt:  yes.  He has retired.  Has been assigned to new PCP.   Vaccines are up to date:  yes Colonoscopy:  05/2016, no additional screening recommended MMG:  11/2021 BMD:  12/09/2021 Last pap smear:  not indicated.   H/o abnormal pap smear:  no    reports that she has never smoked. She has never used smokeless tobacco. She reports that she does not drink alcohol and does not use drugs.  Past Medical History:  Diagnosis Date   Cystocele    Heart murmur    echocardiogram scheduled 04/11/13   Hypertension    Osteopenia    Skin cancer     Past Surgical History:  Procedure Laterality Date   TOTAL VAGINAL HYSTERECTOMY  10/10   with mesh cuff suspension    Current Outpatient Medications  Medication Sig Dispense Refill   amLODipine (NORVASC) 2.5 MG tablet      Calcium Carbonate-Vitamin D 600-400 MG-UNIT tablet Take 2 tablets by mouth daily.      NON FORMULARY Trunature Vision Complex w/ Lutein 25mg , Zeaxanthin 5mg ; take 1 tablet every other day     Omega-3 Fatty Acids (FISH OIL) 1200 MG CAPS Take 1 capsule by mouth daily.      Psyllium (METAMUCIL PO) Take by mouth daily.      Triamcinolone Acetonide (NASACORT AQ NA) Place into the nose.     triamterene-hydrochlorothiazide (MAXZIDE-25) 37.5-25 MG per  tablet Take 0.5 tablets by mouth daily.     UNABLE TO FIND [place] allerclear     No current facility-administered medications for this visit.    Family History  Problem Relation Age of Onset   Cancer Sister 41       invasive ductal cancer-oldest and younger sister   Hypertension Mother    Hypertension Father    CVA Paternal Grandfather    Heart Problems Sister        oldest of 7    Review of Systems  Constitutional: Negative.   Genitourinary: Negative.     Exam:   BP 128/86   Pulse (!) 57   Ht 5' 1.5" (1.562 m)   Wt 123 lb (55.8 kg)   LMP 09/18/1989   BMI 22.86 kg/m   Height: 5' 1.5" (156.2 cm)  General appearance: alert, cooperative and appears stated age Breasts: normal appearance, no masses or tenderness Abdomen: soft, non-tender; bowel sounds normal; no masses,  no organomegaly Lymph nodes: Cervical, supraclavicular, and axillary nodes normal.  No abnormal inguinal nodes palpated Neurologic: Grossly normal  Pelvic: External genitalia:  no lesions              Urethra:  normal appearing urethra with  no masses, tenderness or lesions              Bartholins and Skenes: normal                 Vagina: normal appearing vagina with atrophic changes and no discharge, no lesions              Cervix: absent              Pap taken: No. Bimanual Exam:  Uterus:  uterus absent              Adnexa: no mass, fullness, tenderness               Rectovaginal: Confirms               Anus:  normal sphincter tone, no lesions  Chaperone, Ina Homes, CMA, was present for exam.  Assessment/Plan: 1. GYN exam for high-risk Medicare patient - Pap smear not indicated - Mammogram 11/2021 - Colonoscopy 05/2016 - Bone mineral density 12/09/2021 - lab work done with PCP, Dr. Valentina Lucks who has now retired - vaccines reviewed/updated  2. Cystocele, unspecified - stable on exam  3. Essential hypertension - on antihypertensives  4. Family history of breast cancer -continues to desires  yearly breast imaging with mammograms  5. Osteopenia, unspecified location - for now, pt has declined treatment.  Last BMD was 2023.  Will plan to repeat 1-2 years. - Calcium and Vit D supplement dosing reviewed today  6. Serum calcium elevated - will repeat level today

## 2023-02-10 ENCOUNTER — Other Ambulatory Visit (HOSPITAL_BASED_OUTPATIENT_CLINIC_OR_DEPARTMENT_OTHER): Payer: Self-pay | Admitting: Obstetrics & Gynecology

## 2023-02-10 LAB — CALCIUM: Calcium: 10.6 mg/dL — ABNORMAL HIGH (ref 8.7–10.3)

## 2023-02-14 ENCOUNTER — Other Ambulatory Visit (HOSPITAL_BASED_OUTPATIENT_CLINIC_OR_DEPARTMENT_OTHER): Payer: Medicare Other

## 2023-02-15 ENCOUNTER — Other Ambulatory Visit: Payer: Self-pay | Admitting: Physician Assistant

## 2023-02-15 ENCOUNTER — Ambulatory Visit
Admission: RE | Admit: 2023-02-15 | Discharge: 2023-02-15 | Disposition: A | Discharge status: Home / Self Care | Payer: Medicare Other | Source: Ambulatory Visit | Attending: Physician Assistant | Admitting: Physician Assistant

## 2023-02-15 DIAGNOSIS — M7989 Other specified soft tissue disorders: Secondary | ICD-10-CM

## 2023-02-15 DIAGNOSIS — M19041 Primary osteoarthritis, right hand: Secondary | ICD-10-CM | POA: Diagnosis not present

## 2023-02-15 DIAGNOSIS — M19031 Primary osteoarthritis, right wrist: Secondary | ICD-10-CM | POA: Diagnosis not present

## 2023-02-15 LAB — PTH, INTACT AND CALCIUM
Calcium: 10.4 mg/dL — ABNORMAL HIGH (ref 8.7–10.3)
PTH: 49 pg/mL (ref 15–65)

## 2023-02-22 ENCOUNTER — Telehealth (HOSPITAL_BASED_OUTPATIENT_CLINIC_OR_DEPARTMENT_OTHER): Payer: Self-pay | Admitting: *Deleted

## 2023-02-22 ENCOUNTER — Ambulatory Visit (INDEPENDENT_AMBULATORY_CARE_PROVIDER_SITE_OTHER): Payer: Medicare Other | Admitting: Podiatry

## 2023-02-22 DIAGNOSIS — M79675 Pain in left toe(s): Secondary | ICD-10-CM | POA: Diagnosis not present

## 2023-02-22 DIAGNOSIS — M79674 Pain in right toe(s): Secondary | ICD-10-CM

## 2023-02-22 DIAGNOSIS — B351 Tinea unguium: Secondary | ICD-10-CM

## 2023-02-22 NOTE — Telephone Encounter (Signed)
Pt called requesting recommendations on what she should do about her most recent lab results. Advised pt that provider has not yet reviewed them. Advised that I would send request for Dr. Hyacinth Meeker to review and then get back to her with recommendations.

## 2023-02-26 NOTE — Progress Notes (Signed)
  Subjective:  Patient ID: Rose Olson, female    DOB: 03/06/1941,  MRN: [id number]  Chief Complaint  Patient presents with   Nail Problem    Thick painful toenails, 3 month follow up    82 y.o. female returns for follow-up with the above complaint. History confirmed with patient.  She is doing well the nails are becoming thick and elongated causing pain again.  Objective:  Physical Exam: warm, good capillary refill, no trophic changes or ulcerative lesions, normal DP and PT pulses, and normal sensory exam.  Some improvement in the nail appearance but still has thickened elongated yellow discolored nails with subungual debris     Assessment:   1. Pain due to onychomycosis of toenails of both feet       Plan:  Patient was evaluated and treated and all questions answered.  Discussed the etiology and treatment options for the condition in detail with the patient.  Has not had improvement with antifungal therapy, intermittent debridement has been helpful in reducing pain and improving function. Recommended debridement of the nails today. Sharp and mechanical debridement performed of all painful and mycotic nails today. Nails debrided in length and thickness using a nail nipper to level of comfort. Discussed treatment options including appropriate shoe gear. Follow up as needed for painful nails.    Return in about 4 months (around 06/24/2023) for painful thick fungal nails.

## 2023-02-27 NOTE — Telephone Encounter (Signed)
Called pt to let her know that Dr. Hyacinth Meeker has reviewed her labs and she has reached out to a colleague for recommendations on next steps. Advised that we would get back to her once next steps are determined. Pt verbalized understanding.

## 2023-03-01 DIAGNOSIS — H04123 Dry eye syndrome of bilateral lacrimal glands: Secondary | ICD-10-CM | POA: Diagnosis not present

## 2023-03-05 DIAGNOSIS — M19049 Primary osteoarthritis, unspecified hand: Secondary | ICD-10-CM | POA: Diagnosis not present

## 2023-04-14 ENCOUNTER — Other Ambulatory Visit (HOSPITAL_BASED_OUTPATIENT_CLINIC_OR_DEPARTMENT_OTHER): Payer: Self-pay | Admitting: Obstetrics & Gynecology

## 2023-04-17 NOTE — Telephone Encounter (Signed)
Called pt to let her know that Dr. Hyacinth Meeker recommends a 24 hour urine to test for calcium and a serum vitamin D level. Pr provided with appt.

## 2023-04-19 ENCOUNTER — Encounter: Payer: Self-pay | Admitting: Podiatry

## 2023-04-19 ENCOUNTER — Ambulatory Visit (INDEPENDENT_AMBULATORY_CARE_PROVIDER_SITE_OTHER): Payer: Medicare Other | Admitting: Podiatry

## 2023-04-19 ENCOUNTER — Ambulatory Visit (INDEPENDENT_AMBULATORY_CARE_PROVIDER_SITE_OTHER): Payer: Medicare Other

## 2023-04-19 DIAGNOSIS — M2011 Hallux valgus (acquired), right foot: Secondary | ICD-10-CM | POA: Diagnosis not present

## 2023-04-19 DIAGNOSIS — M2041 Other hammer toe(s) (acquired), right foot: Secondary | ICD-10-CM | POA: Diagnosis not present

## 2023-04-19 DIAGNOSIS — M21611 Bunion of right foot: Secondary | ICD-10-CM | POA: Diagnosis not present

## 2023-04-19 DIAGNOSIS — M21961 Unspecified acquired deformity of right lower leg: Secondary | ICD-10-CM | POA: Diagnosis not present

## 2023-04-19 DIAGNOSIS — M7751 Other enthesopathy of right foot: Secondary | ICD-10-CM | POA: Diagnosis not present

## 2023-04-19 DIAGNOSIS — M7741 Metatarsalgia, right foot: Secondary | ICD-10-CM | POA: Diagnosis not present

## 2023-04-19 NOTE — Progress Notes (Signed)
  Subjective:  Patient ID: Rose Olson, female    DOB: 05-05-1941,  MRN: [id number]  Chief Complaint  Patient presents with   Foot Pain    "My foot is swollen up and it hurts." N - swelling and pain L - right 2nd mpj D - 5-6 weeks O - suddenly, gotten worse C - swelling, dull ache, wakes me up, gets real red A - standing or walking T - Tylenol    82 y.o. female presents with the above complaint. History confirmed with patient.  This is a new problem and has been very inflamed.  She was hoping it would go away on its own with the appropriate shoes and resting but it has not.  She is on her feet quite a lot.  The pain is in the joint behind the second toe and feels quite swollen and tender.  Objective:  Physical Exam: warm, good capillary refill, no trophic changes or ulcerative lesions, normal DP and PT pulses, normal sensory exam, and pain swelling and tenderness and inflammation in the second MTPJ especially dorsally and with a plantarflexion stretch.   Radiographs: Multiple views x-ray of the right foot: She has an elongated second metatarsal, no evidence of stress fracture Assessment:   1. Capsulitis of metatarsophalangeal (MTP) joint of right foot   2. Metatarsalgia of right foot   3. Metatarsal deformity, right      Plan:  Patient was evaluated and treated and all questions answered.  I reviewed her radiographs.  There is no acute fracture or stress fracture.  We discussed the elongated second metatarsal head that relates to strain on the joint she also is hallux valgus deformity and hammertoes contributing to this as well with insufficiency of the first ray.  Her joint was acutely inflamed today and we discussed treatment options.  I recommended corticosteroid injection.  Following consent and sterile prep with Betadine the right second MTPJ was injected with 4 mg dexamethasone.  She tolerated as well.  We discussed the recovery process and resting, low impact  ambulatory activity is okay but would avoid extreme dorsiflexion positioning on the foot.  She will be know if it does not resolve, we are scheduled for a regular visit in October and we will follow-up on this at that time.  No follow-ups on file.

## 2023-04-26 ENCOUNTER — Other Ambulatory Visit (HOSPITAL_BASED_OUTPATIENT_CLINIC_OR_DEPARTMENT_OTHER): Payer: Medicare Other

## 2023-05-01 ENCOUNTER — Other Ambulatory Visit: Payer: Self-pay | Admitting: Obstetrics & Gynecology

## 2023-05-10 ENCOUNTER — Encounter (HOSPITAL_BASED_OUTPATIENT_CLINIC_OR_DEPARTMENT_OTHER): Payer: Self-pay | Admitting: *Deleted

## 2023-05-11 NOTE — Telephone Encounter (Signed)
Dr Hyacinth Meeker please review the patient's results from 24 hour urine.  It was ordered due to hypercalcemia.  All results normal except elevated calcium.  Please advise next steps. Thanks Sherrilyn Rist CMA

## 2023-07-05 ENCOUNTER — Ambulatory Visit: Payer: Medicare Other | Admitting: Podiatry

## 2023-07-09 ENCOUNTER — Encounter: Payer: Self-pay | Admitting: Podiatry

## 2023-07-09 ENCOUNTER — Ambulatory Visit (INDEPENDENT_AMBULATORY_CARE_PROVIDER_SITE_OTHER): Payer: Medicare Other | Admitting: Podiatry

## 2023-07-09 VITALS — Ht 61.5 in | Wt 123.0 lb

## 2023-07-09 DIAGNOSIS — M79674 Pain in right toe(s): Secondary | ICD-10-CM | POA: Diagnosis not present

## 2023-07-09 DIAGNOSIS — M79675 Pain in left toe(s): Secondary | ICD-10-CM

## 2023-07-09 DIAGNOSIS — B351 Tinea unguium: Secondary | ICD-10-CM | POA: Diagnosis not present

## 2023-07-10 NOTE — Progress Notes (Signed)
  Subjective:  Patient ID: Rose Olson, female    DOB: 02/27/1941,  MRN: [id number]  Chief Complaint  Patient presents with   Nail Problem    4 month f/u for thick fungal toe nails    82 y.o. female returns for follow-up with the above complaint. History confirmed with patient.  She is doing well the nails are becoming thick and elongated causing pain again.  Doing much better after the injection last visit  Objective:  Physical Exam: warm, good capillary refill, no trophic changes or ulcerative lesions, normal DP and PT pulses, and normal sensory exam.  Some improvement in the nail appearance but still has thickened elongated yellow discolored nails with subungual debris     Assessment:   1. Pain due to onychomycosis of toenails of both feet        Plan:  Patient was evaluated and treated and all questions answered.  Discussed the etiology and treatment options for the condition in detail with the patient.  Prior debridements have been helpful in reducing pain and improving function.  Recommended debridement of the nails today. Sharp and mechanical debridement performed of all painful and mycotic nails today. Nails debrided in length and thickness using a nail nipper to level of comfort. Discussed treatment options including appropriate shoe gear.     Return in about 4 months (around 11/09/2023) for painful thick fungal nails.

## 2023-07-29 ENCOUNTER — Other Ambulatory Visit: Payer: Self-pay | Admitting: Medical Genetics

## 2023-07-29 DIAGNOSIS — Z006 Encounter for examination for normal comparison and control in clinical research program: Secondary | ICD-10-CM

## 2023-08-09 ENCOUNTER — Other Ambulatory Visit (HOSPITAL_COMMUNITY)
Admission: RE | Admit: 2023-08-09 | Discharge: 2023-08-09 | Disposition: A | Discharge status: Home / Self Care | Payer: Medicare Other | Source: Ambulatory Visit | Attending: Oncology | Admitting: Oncology

## 2023-08-09 DIAGNOSIS — Z006 Encounter for examination for normal comparison and control in clinical research program: Secondary | ICD-10-CM

## 2023-08-21 LAB — GENECONNECT MOLECULAR SCREEN: Genetic Analysis Overall Interpretation: NEGATIVE

## 2023-08-27 DIAGNOSIS — Z5181 Encounter for therapeutic drug level monitoring: Secondary | ICD-10-CM | POA: Diagnosis not present

## 2023-08-27 DIAGNOSIS — M8580 Other specified disorders of bone density and structure, unspecified site: Secondary | ICD-10-CM | POA: Diagnosis not present

## 2023-08-27 DIAGNOSIS — Z13220 Encounter for screening for lipoid disorders: Secondary | ICD-10-CM | POA: Diagnosis not present

## 2023-08-27 DIAGNOSIS — I10 Essential (primary) hypertension: Secondary | ICD-10-CM | POA: Diagnosis not present

## 2023-08-27 DIAGNOSIS — Z136 Encounter for screening for cardiovascular disorders: Secondary | ICD-10-CM | POA: Diagnosis not present

## 2023-08-27 DIAGNOSIS — Z0000 Encounter for general adult medical examination without abnormal findings: Secondary | ICD-10-CM | POA: Diagnosis not present

## 2023-09-21 DIAGNOSIS — H903 Sensorineural hearing loss, bilateral: Secondary | ICD-10-CM | POA: Diagnosis not present

## 2023-10-01 IMAGING — CR DG CHEST 2V
2 series · 2 of 2 positions shown · non-contrast
Comparison: 05/11/2016

CLINICAL DATA: Productive cough

EXAM:
CHEST - 2 VIEW

[w chest pa]
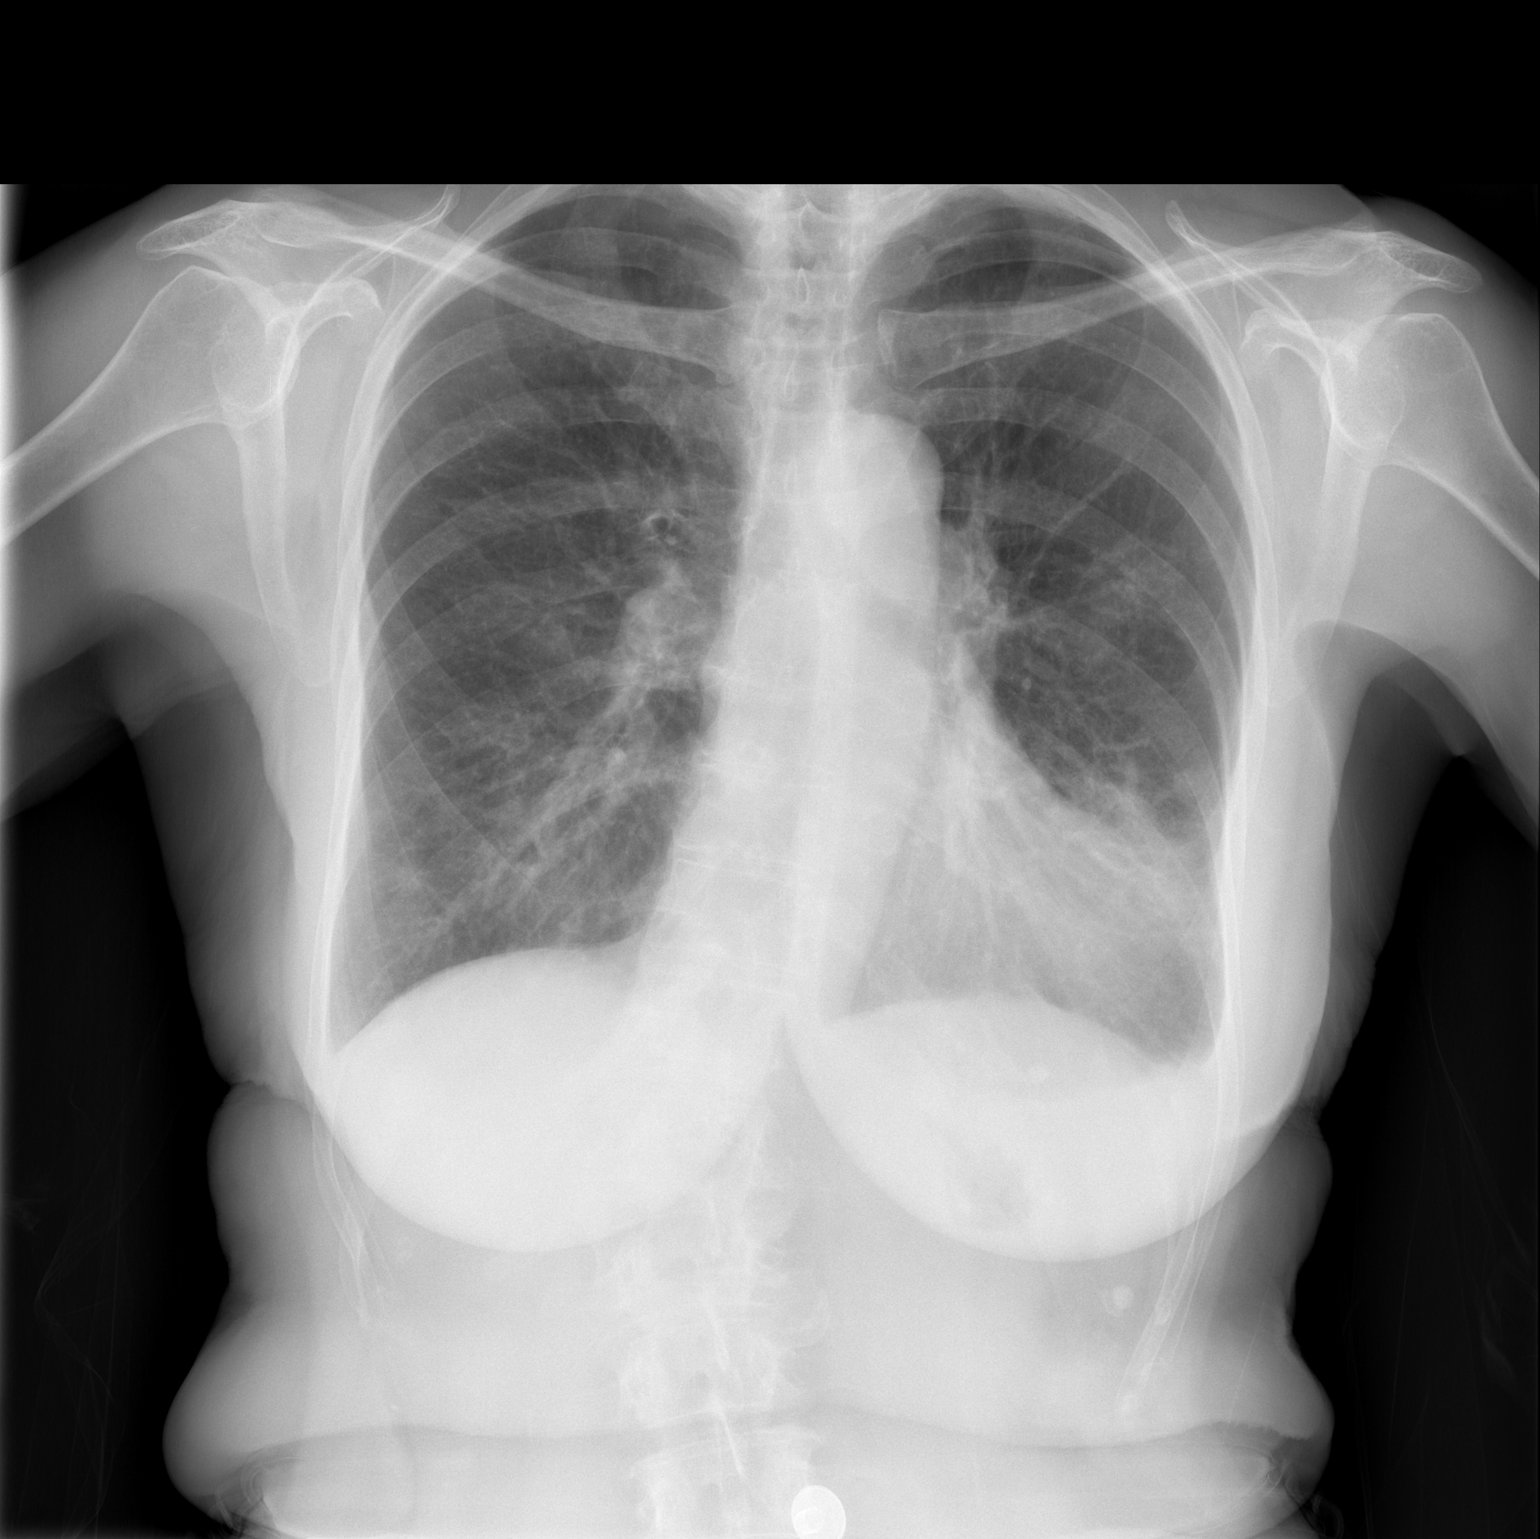

[w chest lat]
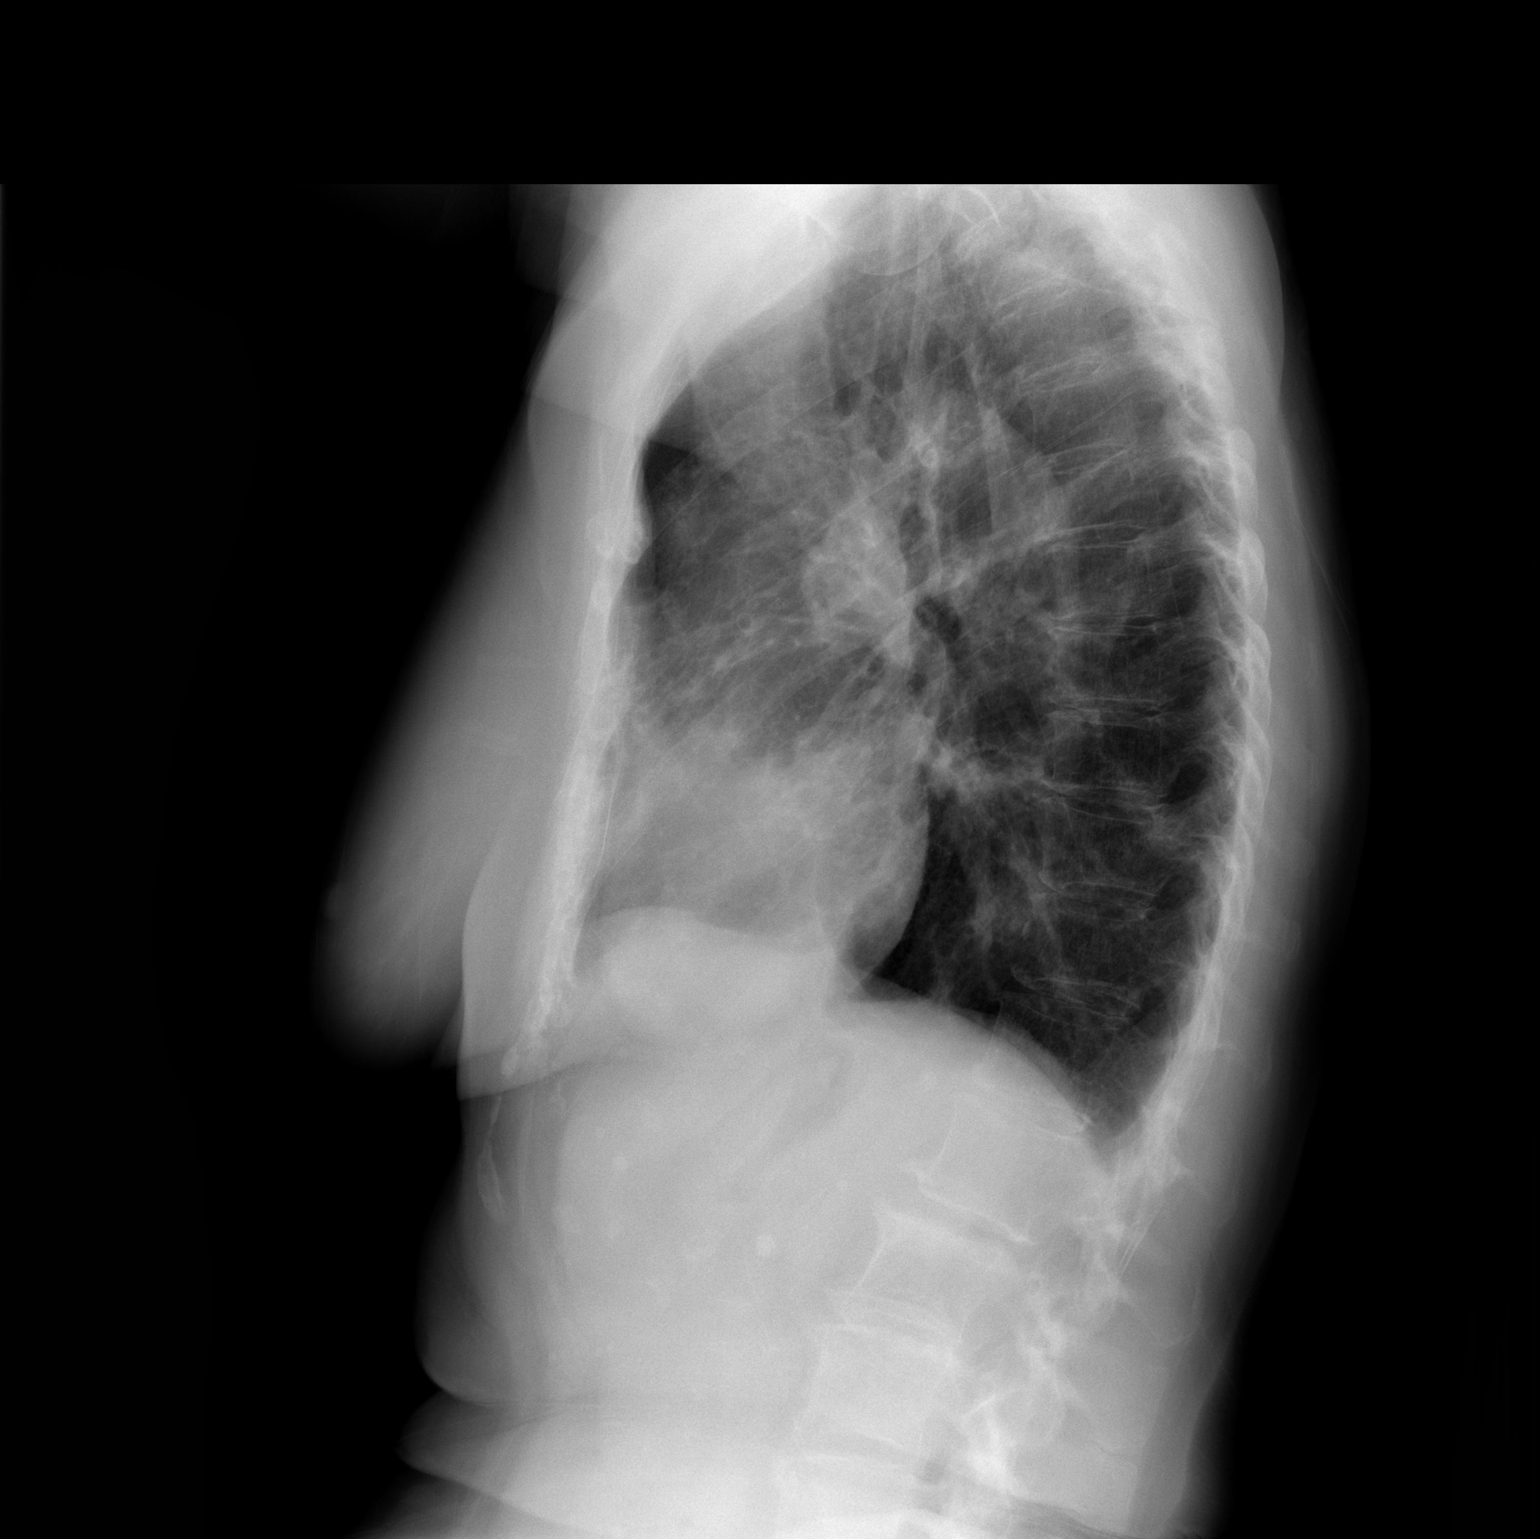

[2 of 2 positions shown; findings below may reference images not displayed]

FINDINGS: Cardiomegaly. Heterogeneous airspace opacity of the left lung base.
The visualized skeletal structures are unremarkable.
IMPRESSION: Heterogeneous airspace opacity of the left lung base, concerning for
infection or aspiration.

## 2023-10-24 DIAGNOSIS — Z85828 Personal history of other malignant neoplasm of skin: Secondary | ICD-10-CM | POA: Diagnosis not present

## 2023-10-24 DIAGNOSIS — L814 Other melanin hyperpigmentation: Secondary | ICD-10-CM | POA: Diagnosis not present

## 2023-10-24 DIAGNOSIS — D225 Melanocytic nevi of trunk: Secondary | ICD-10-CM | POA: Diagnosis not present

## 2023-10-24 DIAGNOSIS — L720 Epidermal cyst: Secondary | ICD-10-CM | POA: Diagnosis not present

## 2023-10-24 DIAGNOSIS — D2262 Melanocytic nevi of left upper limb, including shoulder: Secondary | ICD-10-CM | POA: Diagnosis not present

## 2023-10-24 DIAGNOSIS — D1801 Hemangioma of skin and subcutaneous tissue: Secondary | ICD-10-CM | POA: Diagnosis not present

## 2023-10-24 DIAGNOSIS — D2261 Melanocytic nevi of right upper limb, including shoulder: Secondary | ICD-10-CM | POA: Diagnosis not present

## 2023-10-24 DIAGNOSIS — D2239 Melanocytic nevi of other parts of face: Secondary | ICD-10-CM | POA: Diagnosis not present

## 2023-10-24 DIAGNOSIS — L821 Other seborrheic keratosis: Secondary | ICD-10-CM | POA: Diagnosis not present

## 2023-10-30 ENCOUNTER — Other Ambulatory Visit: Payer: Self-pay | Admitting: Obstetrics & Gynecology

## 2023-10-30 DIAGNOSIS — Z0000 Encounter for general adult medical examination without abnormal findings: Secondary | ICD-10-CM

## 2023-11-12 ENCOUNTER — Ambulatory Visit (INDEPENDENT_AMBULATORY_CARE_PROVIDER_SITE_OTHER): Payer: Medicare Other | Admitting: Podiatry

## 2023-11-12 DIAGNOSIS — M79674 Pain in right toe(s): Secondary | ICD-10-CM

## 2023-11-12 DIAGNOSIS — B351 Tinea unguium: Secondary | ICD-10-CM | POA: Diagnosis not present

## 2023-11-12 DIAGNOSIS — M79675 Pain in left toe(s): Secondary | ICD-10-CM | POA: Diagnosis not present

## 2023-11-12 DIAGNOSIS — H35373 Puckering of macula, bilateral: Secondary | ICD-10-CM | POA: Diagnosis not present

## 2023-11-12 DIAGNOSIS — H35363 Drusen (degenerative) of macula, bilateral: Secondary | ICD-10-CM | POA: Diagnosis not present

## 2023-11-14 NOTE — Progress Notes (Signed)
  Subjective:  Patient ID: Rose Olson, female    DOB: 1940-10-01,  MRN: [id number]  Chief Complaint  Patient presents with   Nail Problem    Nail trim     83 y.o. female returns for follow-up with the above complaint. History confirmed with patient.  She is doing well the nails are becoming thick and elongated causing pain again.  No other new issues Objective:  Physical Exam: warm, good capillary refill, no trophic changes or ulcerative lesions, normal DP and PT pulses, and normal sensory exam.  Some improvement in the nail appearance but still has thickened elongated yellow discolored nails with subungual debris     Assessment:   1. Pain due to onychomycosis of toenails of both feet        Plan:  Patient was evaluated and treated and all questions answered.  Discussed the etiology and treatment options for the condition in detail with the patient.  Prior debridements have been helpful in reducing pain and improving function.  Recommended debridement of the nails today. Sharp and mechanical debridement performed of all painful and mycotic nails today. Nails debrided in length and thickness using a nail nipper to level of comfort. Discussed treatment options including appropriate shoe gear.     Return in about 3 months (around 02/09/2024) for painful thick fungal nails.

## 2023-12-12 ENCOUNTER — Ambulatory Visit: Payer: Medicare Other

## 2023-12-12 ENCOUNTER — Ambulatory Visit
Admission: RE | Admit: 2023-12-12 | Discharge: 2023-12-12 | Disposition: A | Discharge status: Home / Self Care | Source: Ambulatory Visit | Attending: Obstetrics & Gynecology | Admitting: Obstetrics & Gynecology

## 2023-12-12 DIAGNOSIS — Z0000 Encounter for general adult medical examination without abnormal findings: Secondary | ICD-10-CM

## 2023-12-12 DIAGNOSIS — Z1231 Encounter for screening mammogram for malignant neoplasm of breast: Secondary | ICD-10-CM | POA: Diagnosis not present

## 2023-12-28 DIAGNOSIS — J069 Acute upper respiratory infection, unspecified: Secondary | ICD-10-CM | POA: Diagnosis not present

## 2024-02-12 ENCOUNTER — Ambulatory Visit (INDEPENDENT_AMBULATORY_CARE_PROVIDER_SITE_OTHER): Payer: Medicare Other | Admitting: Podiatry

## 2024-02-12 ENCOUNTER — Encounter: Payer: Self-pay | Admitting: Podiatry

## 2024-02-12 DIAGNOSIS — M79674 Pain in right toe(s): Secondary | ICD-10-CM

## 2024-02-12 DIAGNOSIS — B351 Tinea unguium: Secondary | ICD-10-CM

## 2024-02-12 DIAGNOSIS — M79675 Pain in left toe(s): Secondary | ICD-10-CM | POA: Diagnosis not present

## 2024-02-16 NOTE — Progress Notes (Signed)
  Subjective:  Patient ID: Rose  Cherlyn Olson, female    DOB: Jul 24, 1941,  MRN: [id number]  Chief Complaint  Patient presents with   Nail Problem    "Work on my toenails."    83 y.o. female returns for follow-up with the above complaint. History confirmed with patient.  She is doing well the nails are becoming thick and elongated causing pain again.  No other new issues  Objective:  Physical Exam: warm, good capillary refill, no trophic changes or ulcerative lesions, normal DP and PT pulses, and normal sensory exam.  Some improvement in the nail appearance but still has thickened elongated yellow discolored nails with subungual debris     Assessment:   1. Pain due to onychomycosis of toenails of both feet         Plan:  Patient was evaluated and treated and all questions answered.  Discussed the etiology and treatment options for the condition in detail with the patient.  Prior debridements have been helpful in reducing pain and improving function.  Recommended debridement of the nails today. Sharp and mechanical debridement performed of all painful and mycotic nails today. Nails debrided in length and thickness using a nail nipper to level of comfort. Discussed treatment options including appropriate shoe gear.     Return in about 3 months (around 05/14/2024) for painful thick fungal nails.

## 2024-02-21 ENCOUNTER — Ambulatory Visit (INDEPENDENT_AMBULATORY_CARE_PROVIDER_SITE_OTHER): Payer: Medicare Other | Admitting: Obstetrics & Gynecology

## 2024-02-21 ENCOUNTER — Encounter (HOSPITAL_BASED_OUTPATIENT_CLINIC_OR_DEPARTMENT_OTHER): Payer: Self-pay | Admitting: Obstetrics & Gynecology

## 2024-02-21 VITALS — BP 160/78 | HR 54 | Ht 61.5 in | Wt 123.0 lb

## 2024-02-21 DIAGNOSIS — Z01419 Encounter for gynecological examination (general) (routine) without abnormal findings: Secondary | ICD-10-CM

## 2024-02-21 DIAGNOSIS — E785 Hyperlipidemia, unspecified: Secondary | ICD-10-CM

## 2024-02-21 DIAGNOSIS — I10 Essential (primary) hypertension: Secondary | ICD-10-CM | POA: Diagnosis not present

## 2024-02-21 DIAGNOSIS — E2839 Other primary ovarian failure: Secondary | ICD-10-CM

## 2024-02-21 DIAGNOSIS — Z01411 Encounter for gynecological examination (general) (routine) with abnormal findings: Secondary | ICD-10-CM

## 2024-02-21 DIAGNOSIS — M8580 Other specified disorders of bone density and structure, unspecified site: Secondary | ICD-10-CM

## 2024-02-21 DIAGNOSIS — N3943 Post-void dribbling: Secondary | ICD-10-CM

## 2024-02-21 NOTE — Progress Notes (Signed)
   Breast and pelvic exam Patient name: Rose Olson  SHAWNIA VIZCARRONDO MRN [id number]  Date of birth: [birth year] Chief Complaint:   Breast and pelvic exam  History of Present Illness:   Rose  Dyana Glade Olson is a 83 y.o. G7P2002 Caucasian female being seen today for breast and pelvic exam  Having new urinary incontinence.  She has always leaked a little with coughing and sneezing.     Did [employer] study genetic testing 11/21/20224.  This was all negative.    Total cholesterol 242.  LDL 155 Triglycerides 148 HDL 61  Patient's last menstrual period was 09/18/1989.   Last pap:  Hysterectomy done 2010 Last mammogram: 12/12/2023. Results were: normal. Family h/o breast cancer: yes sister Last colonoscopy: 05/25/2006. Results were: normal. Family h/o colorectal cancer: no     02/21/2024    2:38 PM 02/09/2023    9:13 AM 01/27/2022   10:37 AM 01/13/2021    8:32 AM  Depression screen PHQ 2/9  Decreased Interest 0 0 0 0  Down, Depressed, Hopeless 0 0 1 0  PHQ - 2 Score 0 0 1 0      Review of Systems:   Pertinent items are noted in HPI Denies bowel changes or pelvic pain.  Denies vaginal bleeding. Pertinent History Reviewed:  Reviewed past medical,surgical, social and family history.  Reviewed problem list, medications and allergies. Physical Assessment:   Vitals:   02/21/24 1433  BP: (!) 160/78  Pulse: (!) 54  Weight: 123 lb (55.8 kg)  Height: 5' 1.5" (1.562 m)  Body mass index is 22.86 kg/m.        Physical Examination:   General appearance - well appearing, and in no distress  Mental status - alert, oriented to person, place, and time  Psych:  She has a normal mood and affect  Skin - warm and dry, normal color, no suspicious lesions noted  Chest - effort normal, all lung fields clear to auscultation bilaterally  Heart - normal rate and regular rhythm  Neck:  midline trachea, no thyromegaly or nodules  Breasts - breasts appear normal, no suspicious masses, no skin or nipple  changes or axillary nodes  Abdomen - soft, nontender, nondistended, no masses or organomegaly  Pelvic - VULVA: normal appearing vulva with no masses, tenderness or lesions   VAGINA: atrophic, 2nd degree cystocele, shortened vagina   CERVIX: surgically absent  Thin prep pap is not indicated  UTERUS: surgically absent  ADNEXA: No adnexal masses or tenderness noted.  Rectal - normal rectal, good sphincter tone, no masses felt.  Extremities:  No swelling or varicosities noted  Chaperone present for exam  Assessment & Plan:  1. Encntr for gyn exam (general) (routine) w/o abn findings (Primary) - Pap smear not indicated - Mammogram 11/2023 - Colonoscopy 2007 - Bone mineral density ordered - vaccines reviewed/updated  2. Osteopenia, unspecified location - DG BONE DENSITY (DXA); Future  3. Hypoestrogenism  4. Primary hypertension - she will return for labs - Comprehensive metabolic panel with GFR; Future  5. Post-void dribbling - Urine Culture  6. Elevated lipids - Lipid panel; Future   Orders Placed This Encounter  Procedures   Urine Culture   DG BONE DENSITY (DXA)   Comprehensive metabolic panel with GFR   Lipid panel    Meds: No orders of the defined types were placed in this encounter.   Follow-up: Return in about 1 year (around 02/20/2025).  Lillian Rein, MD 02/21/2024 5:00 PM

## 2024-02-22 LAB — URINE CULTURE

## 2024-02-25 ENCOUNTER — Ambulatory Visit (HOSPITAL_BASED_OUTPATIENT_CLINIC_OR_DEPARTMENT_OTHER): Payer: Self-pay | Admitting: Obstetrics & Gynecology

## 2024-03-04 ENCOUNTER — Other Ambulatory Visit (HOSPITAL_BASED_OUTPATIENT_CLINIC_OR_DEPARTMENT_OTHER): Payer: Self-pay

## 2024-03-04 ENCOUNTER — Other Ambulatory Visit (HOSPITAL_COMMUNITY): Payer: Self-pay | Admitting: Internal Medicine

## 2024-03-04 DIAGNOSIS — I10 Essential (primary) hypertension: Secondary | ICD-10-CM

## 2024-03-04 DIAGNOSIS — E785 Hyperlipidemia, unspecified: Secondary | ICD-10-CM | POA: Diagnosis not present

## 2024-03-05 LAB — COMPREHENSIVE METABOLIC PANEL WITH GFR
ALT: 12 IU/L (ref 0–32)
AST: 18 IU/L (ref 0–40)
Albumin: 4.1 g/dL (ref 3.7–4.7)
Alkaline Phosphatase: 68 IU/L (ref 44–121)
BUN/Creatinine Ratio: 17 (ref 12–28)
BUN: 15 mg/dL (ref 8–27)
Bilirubin Total: 0.4 mg/dL (ref 0.0–1.2)
CO2: 23 mmol/L (ref 20–29)
Calcium: 9.6 mg/dL (ref 8.7–10.3)
Chloride: 102 mmol/L (ref 96–106)
Creatinine, Ser: 0.87 mg/dL (ref 0.57–1.00)
Globulin, Total: 2.7 g/dL (ref 1.5–4.5)
Glucose: 88 mg/dL (ref 70–99)
Potassium: 4.5 mmol/L (ref 3.5–5.2)
Sodium: 141 mmol/L (ref 134–144)
Total Protein: 6.8 g/dL (ref 6.0–8.5)
eGFR: 66 mL/min/{1.73_m2} (ref >59)

## 2024-03-05 LAB — LIPID PANEL
Chol/HDL Ratio: 3.2 ratio (ref 0.0–4.4)
Cholesterol, Total: 177 mg/dL (ref 100–199)
HDL: 55 mg/dL (ref >39)
LDL Chol Calc (NIH): 105 mg/dL — ABNORMAL HIGH (ref 0–99)
Triglycerides: 96 mg/dL (ref 0–149)
VLDL Cholesterol Cal: 17 mg/dL (ref 5–40)

## 2024-03-12 ENCOUNTER — Ambulatory Visit: Admission: EM | Admit: 2024-03-12 | Discharge: 2024-03-12 | Disposition: A | Discharge status: Home / Self Care

## 2024-03-12 ENCOUNTER — Encounter: Payer: Self-pay | Admitting: Emergency Medicine

## 2024-03-12 DIAGNOSIS — S80862A Insect bite (nonvenomous), left lower leg, initial encounter: Secondary | ICD-10-CM | POA: Diagnosis not present

## 2024-03-12 DIAGNOSIS — W57XXXA Bitten or stung by nonvenomous insect and other nonvenomous arthropods, initial encounter: Secondary | ICD-10-CM | POA: Diagnosis not present

## 2024-03-12 DIAGNOSIS — N8110 Cystocele, unspecified: Secondary | ICD-10-CM | POA: Insufficient documentation

## 2024-03-12 DIAGNOSIS — H3530 Unspecified macular degeneration: Secondary | ICD-10-CM | POA: Insufficient documentation

## 2024-03-12 MED ORDER — DOXYCYCLINE HYCLATE 100 MG PO CAPS: 100.0000 mg | ORAL_CAPSULE | Freq: Two times a day (BID) | ORAL | 0 refills | Status: AC

## 2024-03-12 NOTE — Discharge Instructions (Signed)
 See your Physician for recheck in 1 week.  Take antibiotic as directed.

## 2024-03-12 NOTE — ED Triage Notes (Signed)
 Pt st's she removed a tick from behind her left knee 5 days ago.  Pt c/o area itching

## 2024-03-12 NOTE — ED Provider Notes (Signed)
 [facility]    CSN: [id number] Arrival date & time: 03/12/24  1002      History   Chief Complaint Chief Complaint  Patient presents with   Insect Bite    HPI Rose Olson is a 83 y.o. female.   Patient reports she removed a tick from behind her left knee 5 days ago.  Patient states the area is red itchy and irritated.  Patient denies any fever any chills she has not had a rash she denies any cough no headache.  The history is provided by the patient. No language interpreter was used.    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 Authorizing Provider  docusate sodium (STOOL SOFTENER) 100 MG capsule Take 100 mg by mouth daily as needed. 03/18/08  Yes [provider]  doxycycline (VIBRAMYCIN) 100 MG capsule Take 1 capsule (100 mg total) by mouth 2 (two) times daily. 03/12/24  Yes Ranie Chinchilla K, PA-C  amLODipine (NORVASC) 2.5 MG tablet  12/09/20   [provider]  Calcium Carbonate-Vitamin D  600-400 MG-UNIT tablet Take 2 tablets by mouth daily.     [provider]   NON FORMULARY Trunature Vision Complex w/ Lutein 25mg , Zeaxanthin 5mg ; take 1 tablet every other day    [provider]  Omega-3 Fatty Acids (FISH OIL) 1200 MG CAPS Take 1 capsule by mouth daily.     [provider]  Psyllium (METAMUCIL PO) Take by mouth daily.     [provider]  Triamcinolone Acetonide (NASACORT AQ NA) Place into the nose.    [provider]  triamterene-hydrochlorothiazide (MAXZIDE-25) 37.5-25 MG per tablet Take 0.5 tablets by mouth daily.    [provider]  CARMEL TO FIND [facility] allerclear    [provider]    Family History Family History  Problem Relation Age of Onset   Cancer Sister 68       invasive ductal cancer-oldest and younger sister   Hypertension Mother    Hypertension Father    CVA Paternal Grandfather    Heart Problems Sister        oldest of 7    Social History Social History   Tobacco Use   Smoking status: Never   Smokeless tobacco: Never  Vaping Use  Vaping status: Never Used  Substance Use Topics   Alcohol use: No   Drug use: No     Allergies   Patient has no known allergies.   Review of Systems Review of Systems  All other systems reviewed and are negative.    Physical Exam Triage Vital Signs ED Triage Vitals  Encounter Vitals Group     BP 03/12/24 1100 129/77     Girls Systolic BP Percentile --      Girls Diastolic BP Percentile --      Boys Systolic BP Percentile --      Boys Diastolic BP Percentile --      Pulse Rate 03/12/24 1100 64     Resp 03/12/24 1100 16     Temp 03/12/24 1100 97.8 F (36.6 C)     Temp Source 03/12/24 1100 Oral     SpO2 03/12/24 1100 95 %     Weight --      Height --      Head Circumference --      Peak Flow --      Pain Score 03/12/24 1101 0     Pain Loc --      Pain Education --      Exclude from Growth Chart --    No data found.  Updated Vital Signs BP 129/77 (BP Location: Left Arm)   Pulse 64   Temp 97.8 F (36.6 C)  (Oral)   Resp 16   LMP 09/18/1989   SpO2 95%   Visual Acuity Right Eye Distance:   Left Eye Distance:   Bilateral Distance:    Right Eye Near:   Left Eye Near:    Bilateral Near:     Physical Exam Vitals and nursing note reviewed.  Constitutional:      Appearance: She is well-developed.  HENT:     Head: Normocephalic.   Cardiovascular:     Rate and Rhythm: Normal rate.  Pulmonary:     Effort: Pulmonary effort is normal.   Musculoskeletal:        General: Normal range of motion.   Skin:    General: Skin is warm.     Comments: Approximately 1 cm area of erythema behind left knee, small raised area.   Neurological:     General: No focal deficit present.     Mental Status: She is alert and oriented to person, place, and time.    I removed what may be a scab from tic or possible retained tick piece.  Patient is given a prescription for doxycycline.  I have advised her to follow-up with her primary care physician for recheck in 1 week if she develops fever headache or rash she should be seen sooner.  Patient may benefit from testing for tick related illness. patient symptoms seem to be from retained tick part  UC Treatments / Results  Labs (all labs ordered are listed, but only abnormal results are displayed) Labs Reviewed - No data to display  EKG   Radiology No results found.  Procedures Procedures (including critical care time)  Medications Ordered in UC Medications - No data to display  Initial Impression / Assessment and Plan / UC Course  I have reviewed the triage vital signs and the nursing notes.  Pertinent labs & imaging results that were available during my care of the patient were reviewed by me and considered in my medical decision making (see chart for details).      Final Clinical Impressions(s) /  UC Diagnoses   Final diagnoses:  Tick bite of left lower leg, initial encounter     Discharge Instructions      See your Physician for  recheck in 1 week.  Take antibiotic as directed.     ED Prescriptions     Medication Sig Dispense Auth. Provider   doxycycline (VIBRAMYCIN) 100 MG capsule Take 1 capsule (100 mg total) by mouth 2 (two) times daily. 20 capsule Tia Hieronymus K, PA-C      PDMP not reviewed this encounter. An After Visit Summary was printed and given to the patient.       Flint Sonny POUR, PA-C 03/12/24 1233

## 2024-03-19 DIAGNOSIS — S80262A Insect bite (nonvenomous), left knee, initial encounter: Secondary | ICD-10-CM | POA: Diagnosis not present

## 2024-03-19 DIAGNOSIS — W57XXXA Bitten or stung by nonvenomous insect and other nonvenomous arthropods, initial encounter: Secondary | ICD-10-CM | POA: Diagnosis not present

## 2024-05-13 ENCOUNTER — Ambulatory Visit: Admitting: Podiatry

## 2024-05-13 VITALS — Ht 61.5 in | Wt 123.0 lb

## 2024-05-13 DIAGNOSIS — M79674 Pain in right toe(s): Secondary | ICD-10-CM

## 2024-05-13 DIAGNOSIS — B351 Tinea unguium: Secondary | ICD-10-CM | POA: Diagnosis not present

## 2024-05-13 DIAGNOSIS — M79675 Pain in left toe(s): Secondary | ICD-10-CM | POA: Diagnosis not present

## 2024-05-16 NOTE — Progress Notes (Signed)
  Subjective:  Patient ID: Rose  LELON Olson, female    DOB: 12-Apr-1941,  MRN: [id number]  Chief Complaint  Patient presents with   Nail Problem    RM 3 Patient is here for a follow-up on onychomycosis of the nails. Patient request nail trimming today.    83 y.o. female returns for follow-up with the above complaint. History confirmed with patient.  She is doing well the nails are becoming thick and elongated causing pain again.  No other new issues  Objective:  Physical Exam: warm, good capillary refill, no trophic changes or ulcerative lesions, normal DP and PT pulses, and normal sensory exam.  Some improvement in the nail appearance but still has thickened elongated yellow discolored nails with subungual debris     Assessment:   1. Pain due to onychomycosis of toenails of both feet         Plan:  Patient was evaluated and treated and all questions answered.  Discussed the etiology and treatment options for the condition in detail with the patient.  Prior debridements have been helpful in reducing pain and improving function.  Recommended debridement of the nails today. Sharp and mechanical debridement performed of all painful and mycotic nails today. Nails debrided in length and thickness using a nail nipper to level of comfort. Discussed treatment options including appropriate shoe gear.     Return in about 3 months (around 08/13/2024) for painful thick fungal nails.

## 2024-08-12 ENCOUNTER — Encounter: Payer: Self-pay | Admitting: Podiatry

## 2024-08-12 ENCOUNTER — Ambulatory Visit (INDEPENDENT_AMBULATORY_CARE_PROVIDER_SITE_OTHER): Admitting: Podiatry

## 2024-08-12 DIAGNOSIS — M79675 Pain in left toe(s): Secondary | ICD-10-CM | POA: Diagnosis not present

## 2024-08-12 DIAGNOSIS — B351 Tinea unguium: Secondary | ICD-10-CM

## 2024-08-12 DIAGNOSIS — M79674 Pain in right toe(s): Secondary | ICD-10-CM

## 2024-08-13 NOTE — Progress Notes (Signed)
  Subjective:  Patient ID: Rose Olson  LELON Rouse, female    DOB: 06-16-1941,  MRN: [id number]  Chief Complaint  Patient presents with   Nail Problem    He's been cutting my toenails and helping me with my other issues.    83 y.o. female returns for follow-up with the above complaint. History confirmed with patient.  She is doing well the nails are becoming thick and elongated causing pain again.  No other new issues  Objective:  Physical Exam: warm, good capillary refill, no trophic changes or ulcerative lesions, normal DP and PT pulses, and normal sensory exam.  Some improvement in the nail appearance but still has thickened elongated yellow discolored nails with subungual debris     Assessment:   1. Pain due to onychomycosis of toenails of both feet         Plan:  Patient was evaluated and treated and all questions answered.  Discussed the etiology and treatment options for the condition in detail with the patient.  Prior debridements have been helpful in reducing pain and improving function.  Recommended debridement of the nails today. Sharp and mechanical debridement performed of all painful and mycotic nails today. Nails debrided in length and thickness using a nail nipper to level of comfort. Discussed treatment options including appropriate shoe gear.     Return in about 3 months (around 11/12/2024) for painful thick fungal nails.

## 2024-09-02 DIAGNOSIS — M85832 Other specified disorders of bone density and structure, left forearm: Secondary | ICD-10-CM | POA: Diagnosis not present

## 2024-09-02 DIAGNOSIS — I10 Essential (primary) hypertension: Secondary | ICD-10-CM | POA: Diagnosis not present

## 2024-09-02 DIAGNOSIS — Z5181 Encounter for therapeutic drug level monitoring: Secondary | ICD-10-CM | POA: Diagnosis not present

## 2024-10-06 ENCOUNTER — Ambulatory Visit (HOSPITAL_BASED_OUTPATIENT_CLINIC_OR_DEPARTMENT_OTHER)
Admission: RE | Admit: 2024-10-06 | Discharge: 2024-10-06 | Disposition: A | Discharge status: Home / Self Care | Source: Ambulatory Visit | Attending: Obstetrics & Gynecology | Admitting: Obstetrics & Gynecology

## 2024-10-06 DIAGNOSIS — E2839 Other primary ovarian failure: Secondary | ICD-10-CM | POA: Diagnosis present

## 2024-10-06 DIAGNOSIS — M8580 Other specified disorders of bone density and structure, unspecified site: Secondary | ICD-10-CM | POA: Insufficient documentation

## 2024-10-21 ENCOUNTER — Telehealth (HOSPITAL_BASED_OUTPATIENT_CLINIC_OR_DEPARTMENT_OTHER): Payer: Self-pay

## 2024-10-21 NOTE — Telephone Encounter (Signed)
 Patient left message on nurses' line advising that she had bone density test a couple of weeks ago and would like for Dr. Cleotilde to review. Advised patient that would have Dr. Cleotilde review and call her back.   Morna LOISE Quale, RN

## 2024-11-18 ENCOUNTER — Ambulatory Visit: Admitting: Podiatry

## 2025-02-24 ENCOUNTER — Ambulatory Visit (HOSPITAL_BASED_OUTPATIENT_CLINIC_OR_DEPARTMENT_OTHER): Admitting: Obstetrics & Gynecology
